# Patient Record
Sex: Male | Born: 1945 | Race: Black or African American | Hispanic: No | Marital: Married | Smoking: Never smoker
Health system: Southern US, Community
[De-identification: ages and names within clinical notes are randomized; demographics above are authoritative.]

## PROBLEM LIST (undated history)

## (undated) DIAGNOSIS — C61 Malignant neoplasm of prostate: Secondary | ICD-10-CM

## (undated) DIAGNOSIS — E119 Type 2 diabetes mellitus without complications: Secondary | ICD-10-CM

## (undated) DIAGNOSIS — C801 Malignant (primary) neoplasm, unspecified: Secondary | ICD-10-CM

## (undated) DIAGNOSIS — E785 Hyperlipidemia, unspecified: Secondary | ICD-10-CM

## (undated) DIAGNOSIS — I442 Atrioventricular block, complete: Secondary | ICD-10-CM

## (undated) DIAGNOSIS — I10 Essential (primary) hypertension: Secondary | ICD-10-CM

## (undated) HISTORY — DX: Atrioventricular block, complete: I44.2

## (undated) HISTORY — DX: Type 2 diabetes mellitus without complications: E11.9

## (undated) HISTORY — DX: Hyperlipidemia, unspecified: E78.5

## (undated) HISTORY — PX: APPENDECTOMY: SHX54

---

## 2004-05-21 ENCOUNTER — Encounter: Admission: RE | Admit: 2004-05-21 | Discharge: 2004-05-21 | Payer: Self-pay | Admitting: *Deleted

## 2004-07-22 ENCOUNTER — Ambulatory Visit (HOSPITAL_COMMUNITY): Admission: RE | Admit: 2004-07-22 | Discharge: 2004-07-22 | Payer: Self-pay | Admitting: Gastroenterology

## 2006-04-14 HISTORY — PX: CHOLECYSTECTOMY: SHX55

## 2006-06-30 ENCOUNTER — Encounter: Payer: Self-pay | Admitting: Emergency Medicine

## 2006-07-01 ENCOUNTER — Inpatient Hospital Stay (HOSPITAL_COMMUNITY): Admission: AD | Admit: 2006-07-01 | Discharge: 2006-07-03 | Payer: Self-pay | Admitting: *Deleted

## 2006-07-24 ENCOUNTER — Encounter: Admission: RE | Admit: 2006-07-24 | Discharge: 2006-07-24 | Payer: Self-pay | Admitting: Gastroenterology

## 2006-08-19 ENCOUNTER — Ambulatory Visit: Payer: Self-pay | Admitting: Gastroenterology

## 2006-08-27 ENCOUNTER — Ambulatory Visit (HOSPITAL_COMMUNITY): Admission: RE | Admit: 2006-08-27 | Discharge: 2006-08-27 | Payer: Self-pay | Admitting: Gastroenterology

## 2006-08-27 ENCOUNTER — Encounter: Payer: Self-pay | Admitting: Gastroenterology

## 2006-09-09 ENCOUNTER — Ambulatory Visit: Payer: Self-pay | Admitting: Gastroenterology

## 2006-11-06 ENCOUNTER — Ambulatory Visit (HOSPITAL_COMMUNITY): Admission: RE | Admit: 2006-11-06 | Discharge: 2006-11-06 | Payer: Self-pay | Admitting: General Surgery

## 2006-11-06 ENCOUNTER — Encounter (INDEPENDENT_AMBULATORY_CARE_PROVIDER_SITE_OTHER): Payer: Self-pay | Admitting: General Surgery

## 2006-11-11 ENCOUNTER — Inpatient Hospital Stay (HOSPITAL_COMMUNITY): Admission: AD | Admit: 2006-11-11 | Discharge: 2006-11-16 | Payer: Self-pay | Admitting: General Surgery

## 2006-11-15 ENCOUNTER — Encounter: Payer: Self-pay | Admitting: Gastroenterology

## 2006-11-16 ENCOUNTER — Ambulatory Visit: Payer: Self-pay | Admitting: Gastroenterology

## 2006-12-02 ENCOUNTER — Ambulatory Visit: Payer: Self-pay | Admitting: Gastroenterology

## 2007-07-21 DIAGNOSIS — I10 Essential (primary) hypertension: Secondary | ICD-10-CM | POA: Insufficient documentation

## 2007-07-21 DIAGNOSIS — Z8719 Personal history of other diseases of the digestive system: Secondary | ICD-10-CM | POA: Insufficient documentation

## 2007-09-19 IMAGING — US US ABDOMEN COMPLETE
1 series · 13 of 25 positions shown · non-contrast
Comparison: GI [REDACTED] abdominal ultrasound 05/21/04, [REDACTED] abdominal ultrasound 07/01/06, [REDACTED] abdominal CT 07/01/06, and [REDACTED] abdominal MRI MRCP 07/03/06.

CLINICAL DATA: Follow up acute pancreatitis. 
COMPLETE ABDOMINAL ULTRASOUND:
TECHNIQUE: Complete abdominal ultrasound examination was performed including evaluation of the liver, gallbladder, bile ducts, pancreas, kidneys, spleen, IVC, and abdominal aorta.

[Series 1: us abdomen complete · 0.43mm/px · 13 of 73 slices shown]
[im 1/73]
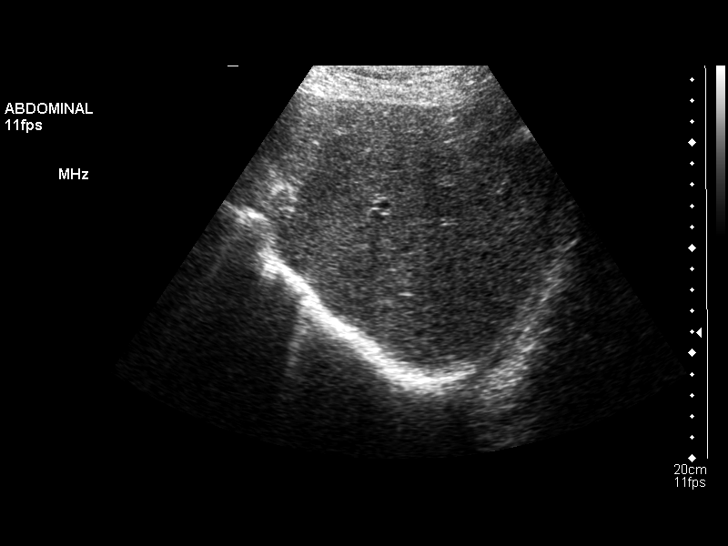
[im 7/73]
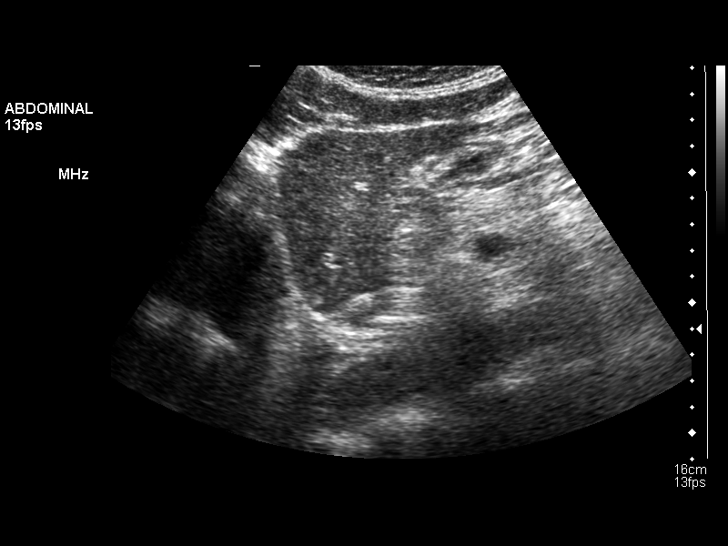
[im 13/73]
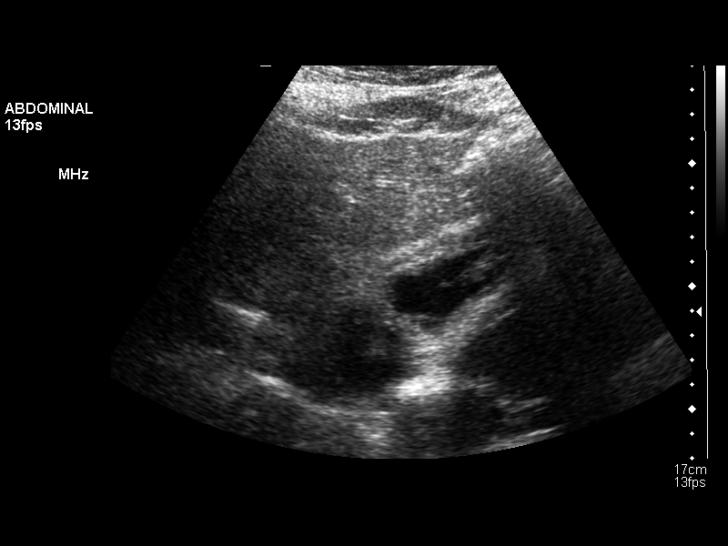
[im 19/73]
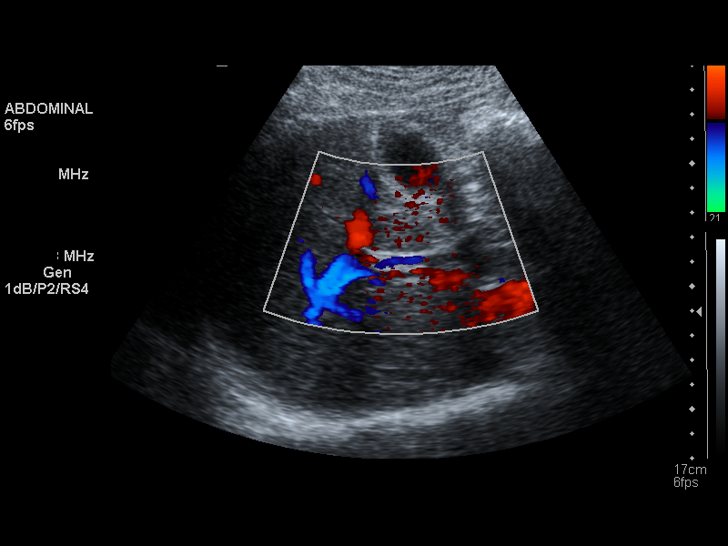
[im 25/73]
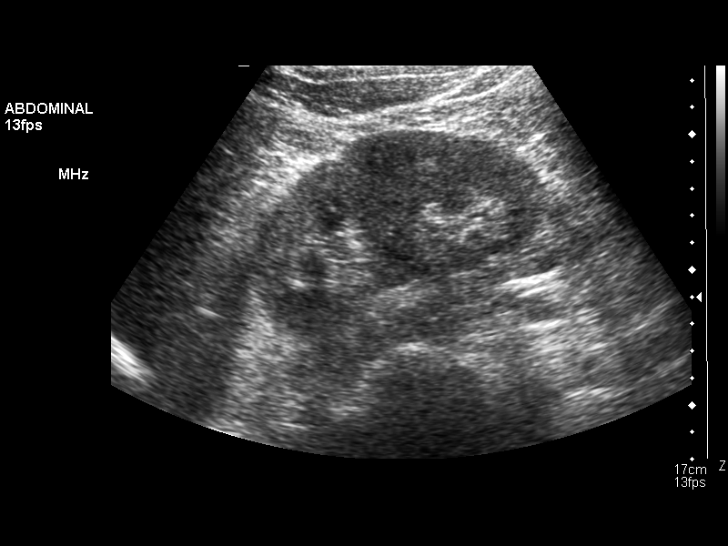
[im 31/73]
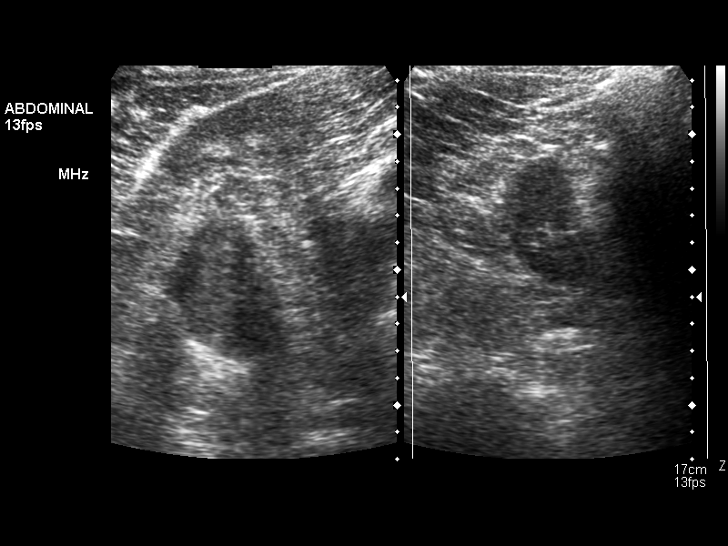
[im 37/73]
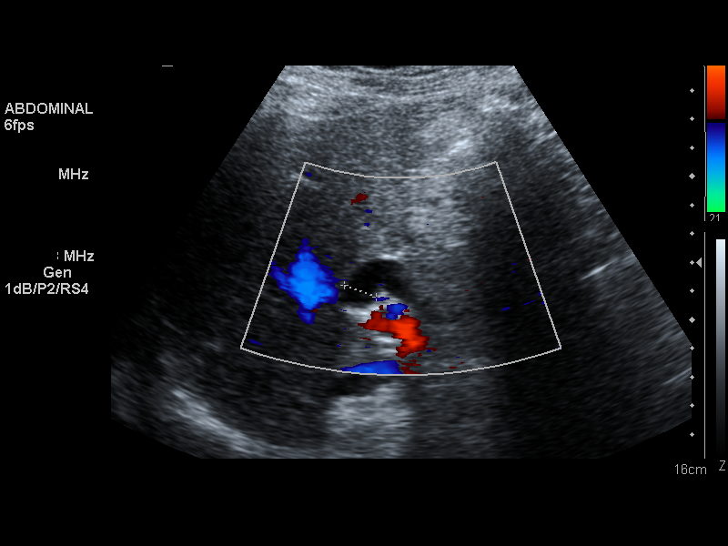
[im 43/73]
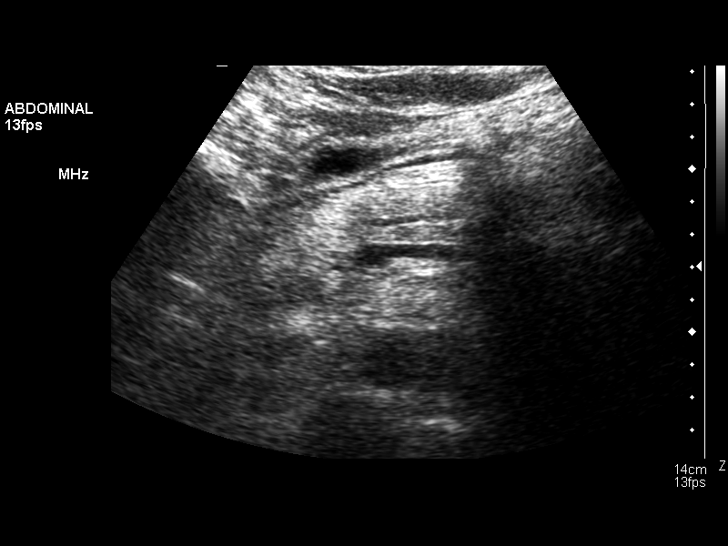
[im 49/73]
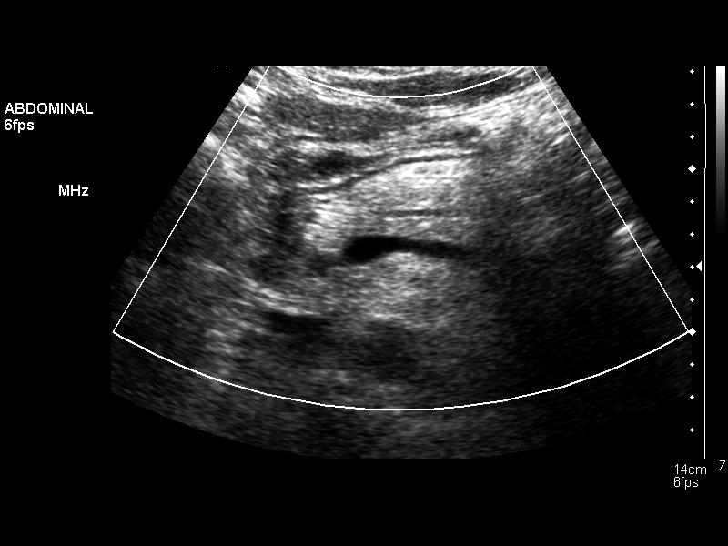
[im 55/73]
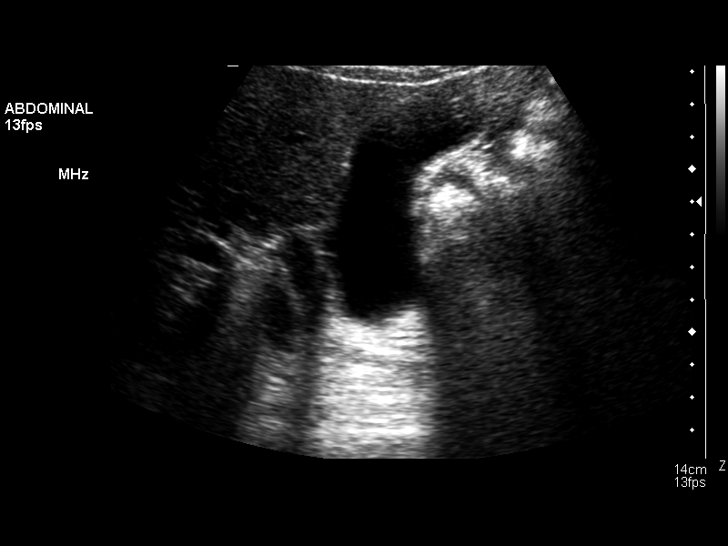
[im 61/73]
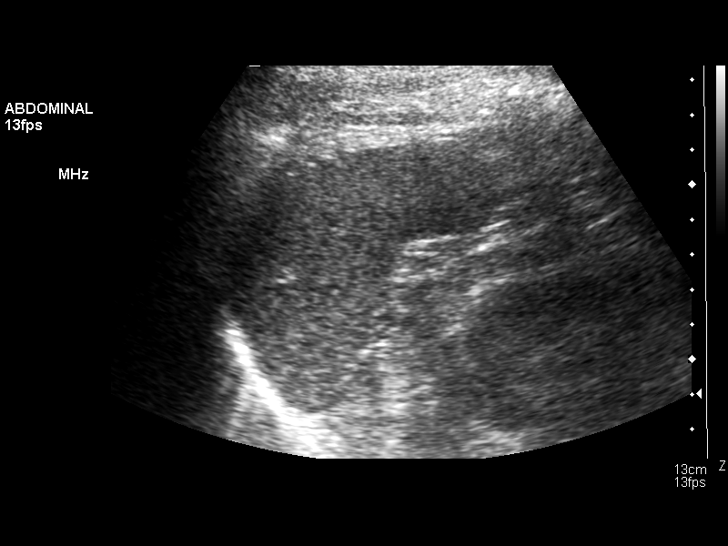
[im 67/73]
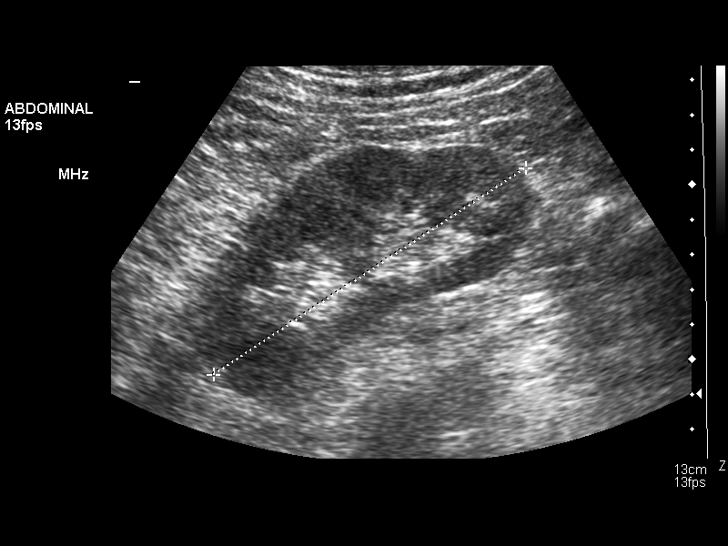
[im 73/73]
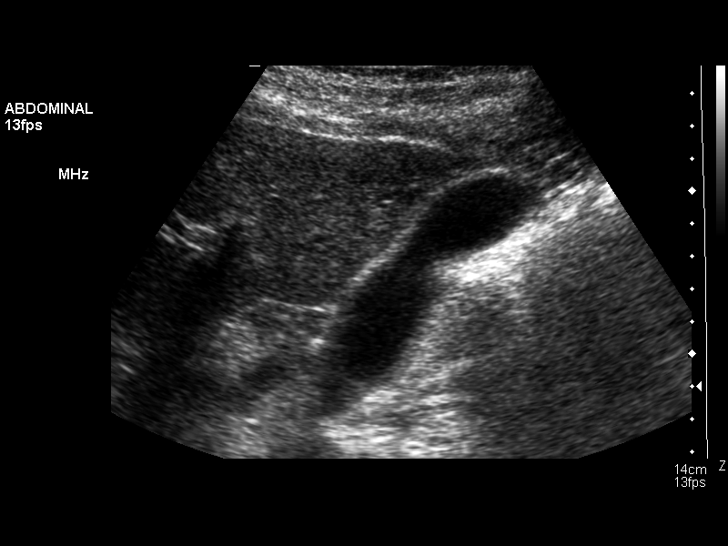

[13 of 25 positions shown; findings below may reference images not displayed]

FINDINGS: No sonographic evidence for cholelithiasis nor gall sludge seen.  Gallbladder wall is slightly indistinct which may represent minimal changes of adenomyomatosis, yet normal wall thickness of 1mm.  No sonographic Murphy?s sign noted.  Stable slight dilatation intrahepatic and extrahepatic bile ducts are seen with the common bile duct measuring 12mm.  Pancreatic duct remains prominent measuring 2.5cm.  Remaining pancreas (tail obscured by overlying gastrointestinal gas) appears sonographically normal.  No focal liver lesions are seen with normal liver size.  Inferior vena cava, spleen (6.5 x 8.8cm), right kidney (12cm long), left kidney (11cm long), and abdominal aorta (maximum proximal diameter 2.4cm) appear sonographically normal.
IMPRESSION: 1.  Stable slight dilated intrahepatic and extrahepatic bile duct and slight prominence of the pancreatic duct with no new obstructing lesion seen. 
2.  Possible mild adenomyomatosis indistinct gallbladder wall of normal thickness. 
3.  Otherwise negative (tail of pancreas obscured).

## 2008-01-07 IMAGING — CR DG CHEST 2V
2 series · 2 of 2 positions shown · non-contrast
Comparison: 06/30/06.

CLINICAL DATA: Abdominal pain, fever and dyspnea.  Gallbladder surgery five days ago. 
 PA AND LATERAL CHEST - 2 VIEW:

[w chest pa]
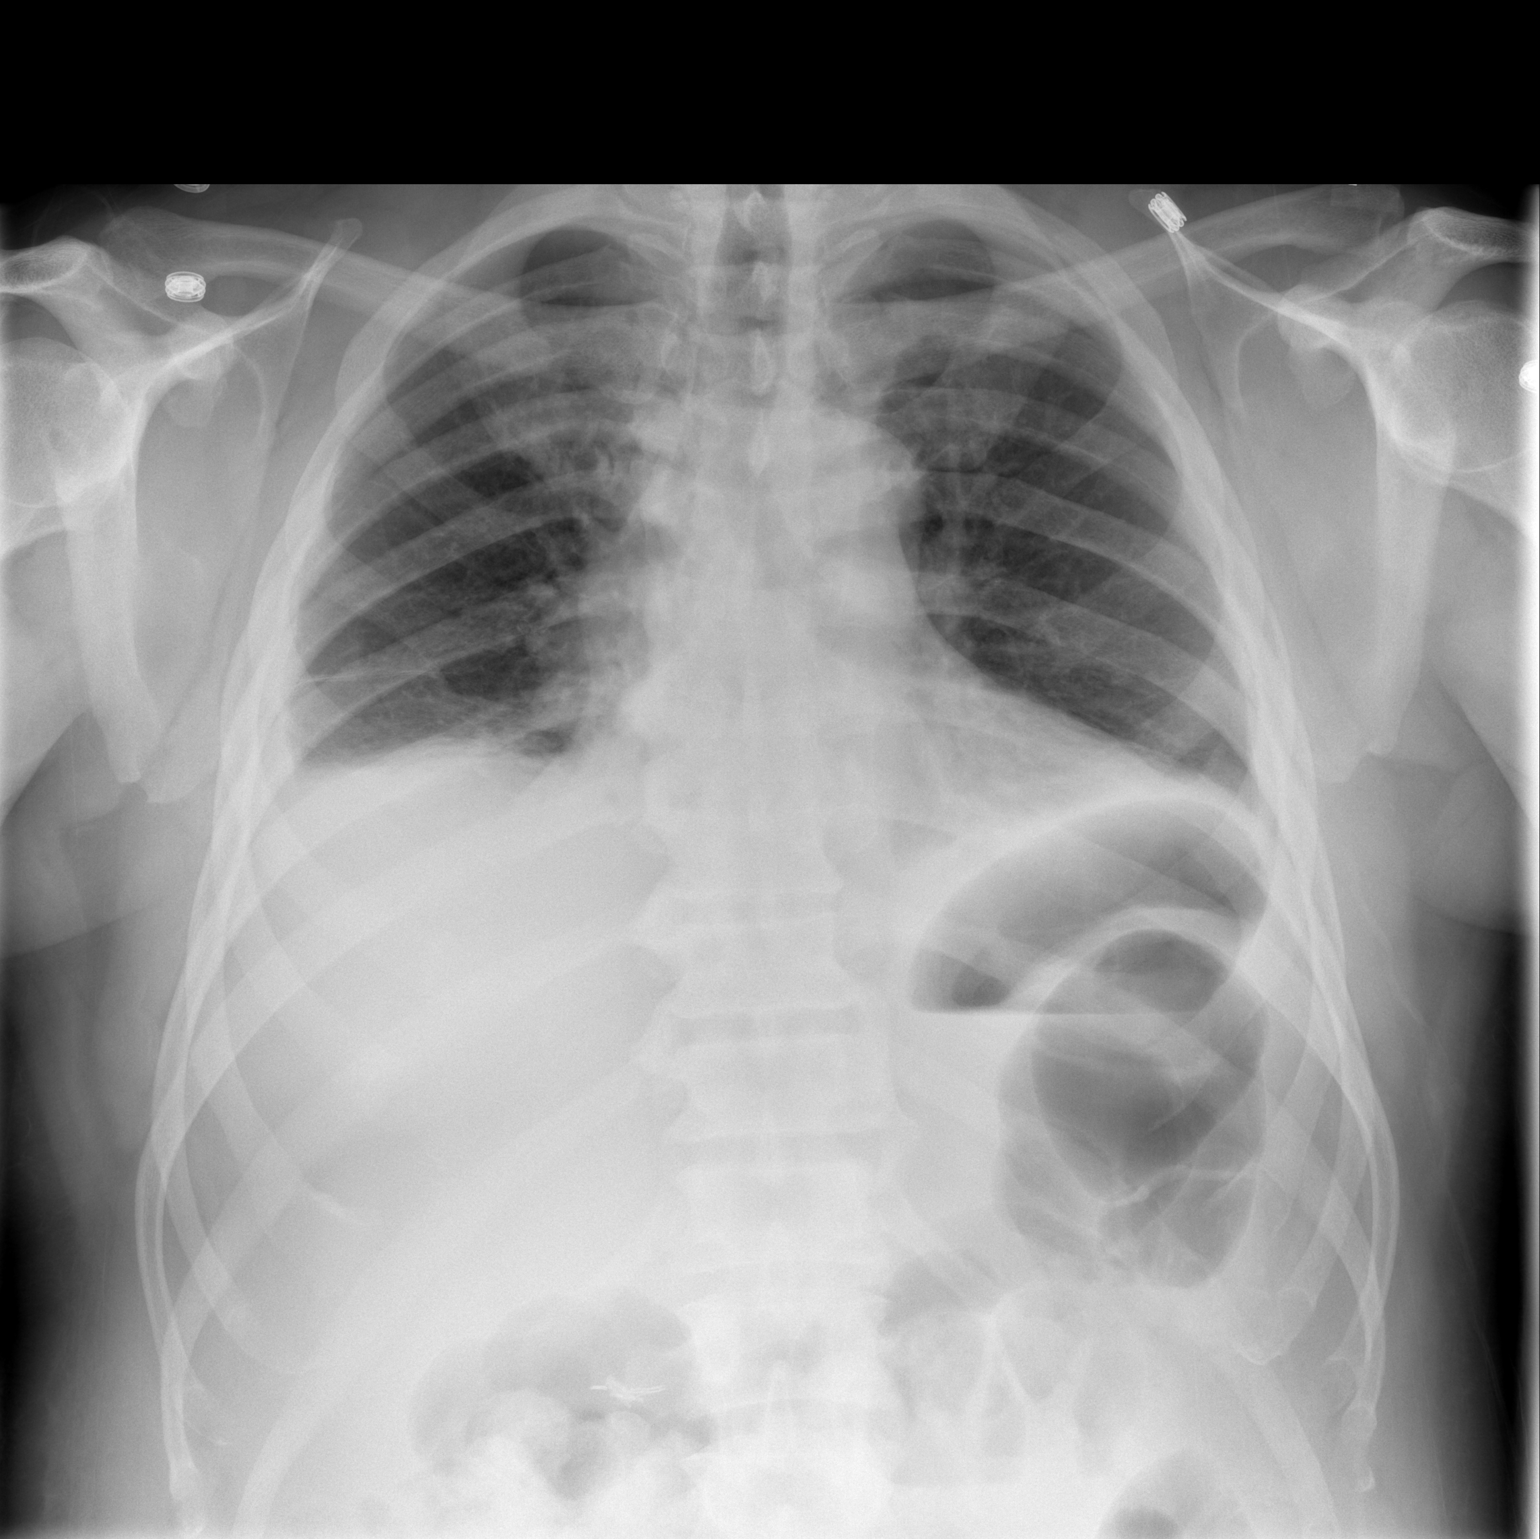

[w chest lat]
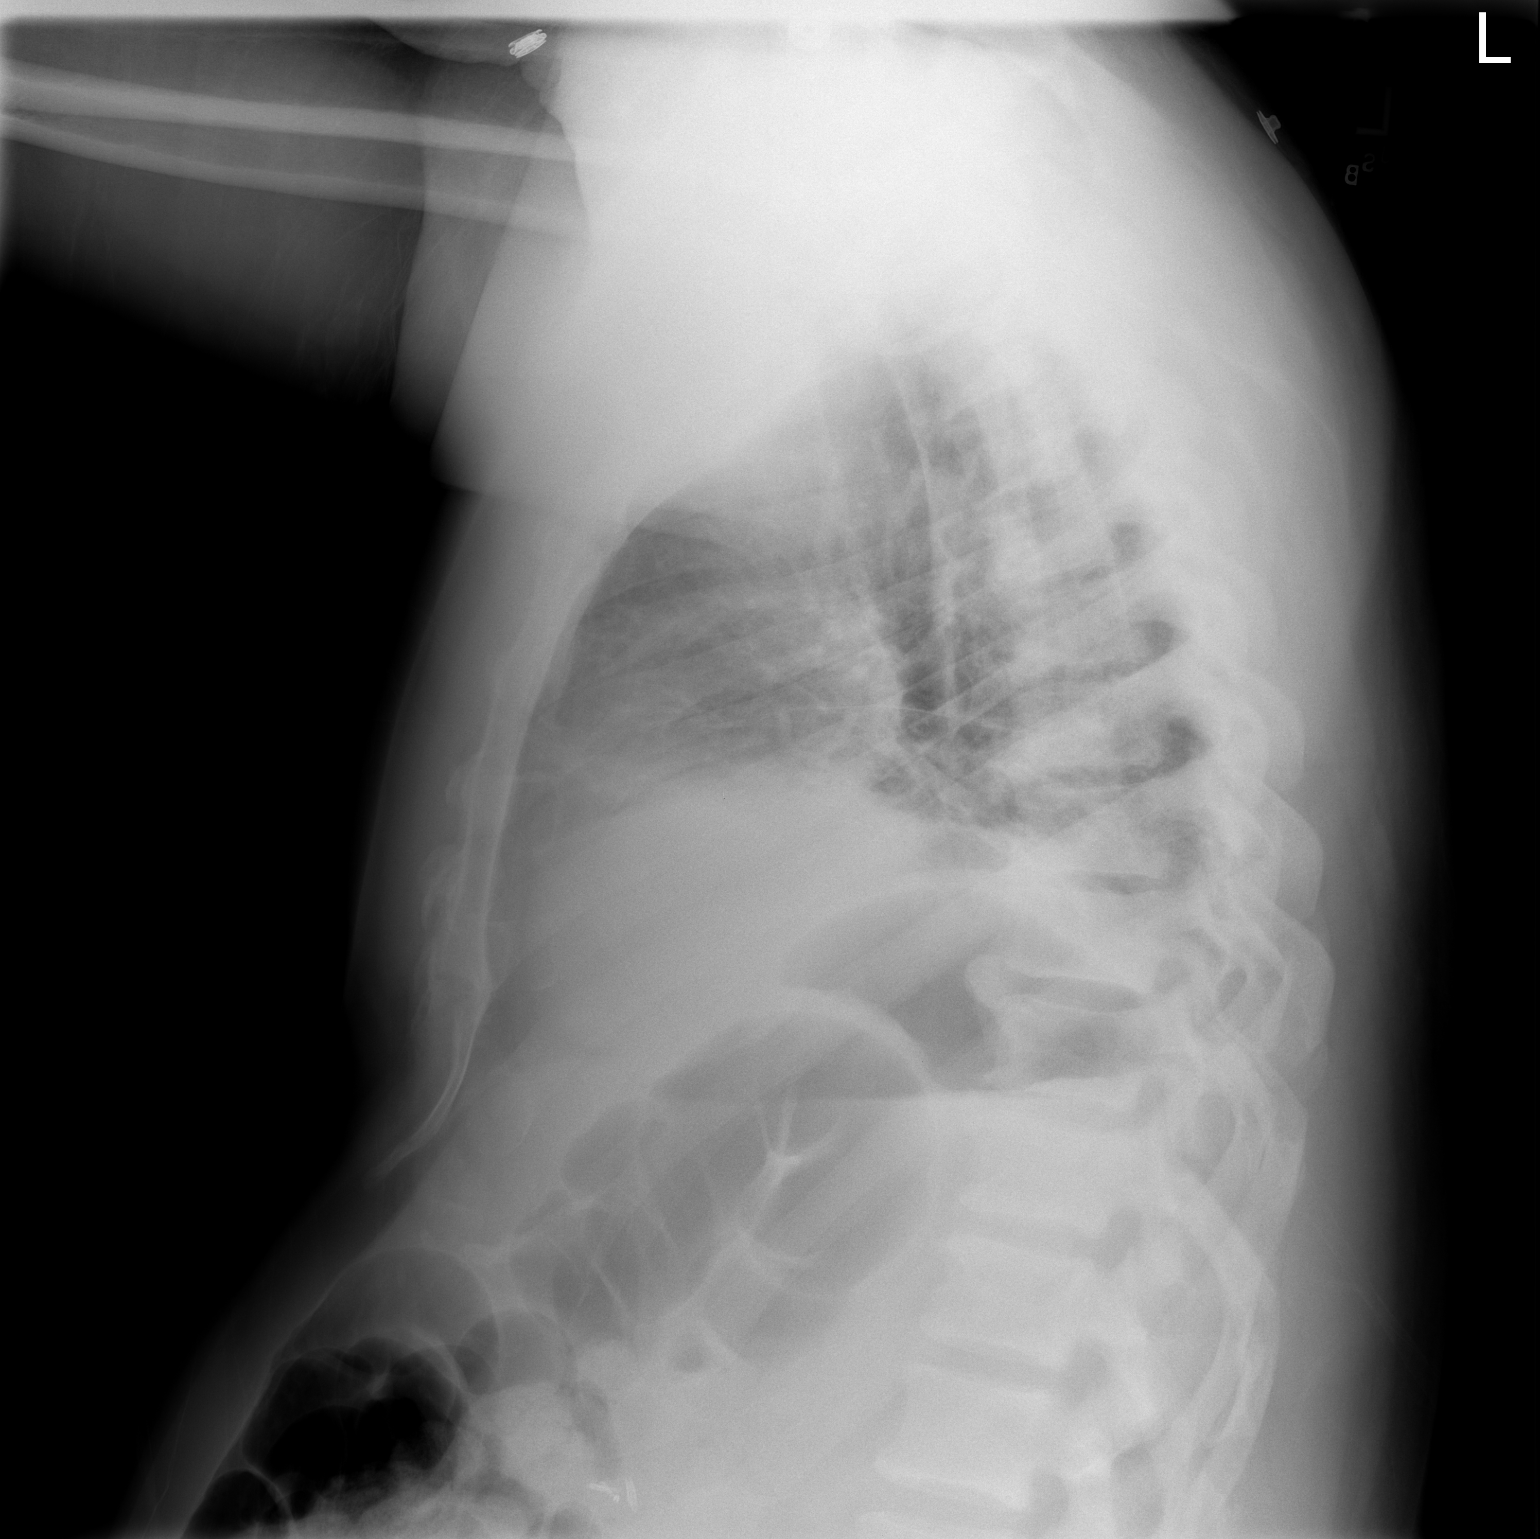

[2 of 2 positions shown; findings below may reference images not displayed]

FINDINGS: There are low lung volumes with new bibasilar atelectasis and a small right pleural effusion.  No edema or pneumothorax is present.  The cardiomediastinal contours are stable for the degree of inspiration with mild cardiac enlargement.  The bones are dense.  Air fluid levels in the upper abdomen are probably due to a postoperative ileus.
IMPRESSION: Bibasilar atelectasis with small right pleural effusion.  Probable postoperative ileus.

## 2010-08-27 NOTE — Discharge Summary (Signed)
NAME:  Corey Avila, Corey Avila              ACCOUNT NO.:  [id number]   MEDICAL RECORD NO.:  [id number]          PATIENT TYPE:  INP   LOCATION:  5729                         FACILITY:  [facility]   PHYSICIAN:  Gabrielle Dare. Janee Morn, M.D.DATE OF BIRTH:  [birth year]   DATE OF ADMISSION:  11/11/2006  DATE OF DISCHARGE:  11/16/2006                               DISCHARGE SUMMARY   DATE OF DISCHARGE:  Anticipated date November 16, 2006.   DISCHARGE DIAGNOSES:  Biliary leak, status post laparoscopic  cholecystectomy.   HISTORY OF PRESENT ILLNESS:  Corey Avila is a 65 year old African-  American gentleman who underwent uncomplicated laparoscopic  cholecystectomy with intraoperative cholangiogram on November 06, 2006.  In  the postoperative period at home, he developed some abdominal pain and  fever.  Around postoperative day 5, he was seen in our office and  admitted from the hospital for further workup.   HOSPITAL COURSE:  The patient underwent evaluation with a CT scan.  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  Liver  function tests were within normal limits.  He had elevated white blood  cell count.  He was placed on intravenous antibiotics.  He underwent CT-  guided drainage of what was a large bile collection, 600 cc or so was  removed.  Drain was left in place.  He improved significantly from a  clinical standpoint.  He defervesced, white count normalized.  He was  seen in consultation by  GI, including Dr. Wendall Papa, who had  seen him in the past for biliary pancreatitis.  He was set up for  endoscopic retrograde cholangiopancreatography.  They were not able to  enter the papilla.  There were some small duodenal ulcers seen.  Further  attempt was made the following day, and again they were unable to  perform ERCP, as they were unable to cannulate the common bile duct.  He  then underwent HIDA scan this morning, which failed to demonstrate any  bile leak; however, he  continues to have bilious output from his drain.  It was recommended by Dr. Christella Hartigan from GI service, may be transferred  for specialty care at [facility] on the biliary service.  Per his  discussions with them today, he has been accepted, pending bed  availability, so he was transferred in stable condition to [facility].   ACTIVITY:  As tolerated.  His drain is to gravity drainage.   MEDICATIONS:  Include:  1. Metoprolol 50 mg p.o. daily.  2. Norvasc 5 mg p.o. daily.  3. Lotensin 20 mg p.o. daily.  4. Protonix 40 mg p.o. daily.  5. IV fluids at this time are 0.9 normal saline with 20 Kay Ciel at      100 mL an hour.  6. He is also on Lovenox 40 mg subcu q. day.  7. Zosyn 3.375 grams IV q.6 h.  8. Tylenol 650 mg p.o. q.4 h. p.r.n.  9. Morphine 2-6 mg IV q.1 h. p.r.n.  10.Lopressor 5 mg IV q.6 h. p.r.n., systolic blood pressure over 160      or diastolic over 90.  Gabrielle Dare Janee Morn, M.D.  Electronically Signed     BET/MEDQ  D:  11/16/2006  T:  11/16/2006  Job:  [id number]

## 2010-08-27 NOTE — Assessment & Plan Note (Signed)
[facility]                         GASTROENTEROLOGY OFFICE NOTE   NAME:Corey Avila, Corey Avila                     MRN:          [id number]  DATE:08/19/2006                            DOB:          Jan 22, 1946    REASON FOR VISIT:  Dr. Evette Cristal asked me to evaluate Corey Avila in  consultation regarding recent pancreatitis.   HISTORY OF PRESENT ILLNESS:  Corey Avila is a very pleasant 65 year old  man who was hospitalized a month and a half ago at [facility]  because of acute abdominal pain.  At admission his amylase and lipase  were both very elevated with a lipase of greater than 1000.  His LFT's  were also elevated with an AST of 222, ALT of 378, and total bilirubin  of 2.7.  It was thought that he may have alcoholic pancreatitis.  He was  formerly a steady drinker, although, the day or two prior to his acute  abdominal pain he had only had 1-2 drinks.  Ultrasound suggested dilated  bile duct, slightly dilated pancreatic duct.  CT scan was performed  showing the same without any overt pancreatic inflammation.  An MRCP was  also performed and this showed diffuse dilation of the entire biliary  tree, questionable tiny stone in the neck of the gallbladder, mild edema  in the gallbladder wall, mild prominence of the pancreatic duct.  He was  discharged home after 3-4 days of conservative management and he has had  no return of his pain since then.  He followed up with Dr. Evette Cristal in the  office.  A repeat ultrasound was performed and this showed stable  dilated intra and extrahepatic bile duct with mild prominence of the PD.  Repeat liver tests were normal.  His lipase was still elevated very  mildly at 78.   He never had pancreatic problems in the past.  No recent medicine  changes.  No abdominal injuries recently.   REVIEW OF SYSTEMS:  Notable for 15 pound weight gain in the past year,  otherwise essentially normal and is available on his nursing  intake  sheet.   PAST MEDICAL HISTORY:  Hypertension, status post appendectomy in 1975.   CURRENT MEDICATIONS:  1. Metoprolol.  2. Vitamin E.  3. Aspirin.  4. Prostiva.  5. Vitamin C.  6. Replenex.  7. Lisinopril.  8. Prostate vitamin.  9. Vitality vitamin.   ALLERGIES:  No known drug allergies.   SOCIAL HISTORY:  Nonsmoker.  He drinks 1-2 alcoholic drinks daily.   FAMILY HISTORY:  Prostate cancer runs in his family.  No colon cancer or  colon polyps in his family.   PHYSICAL EXAMINATION:  VITAL SIGNS:  5 feet 10 inches, 251 pounds, blood  pressure 142/92, pulse 68.  CONSTITUTIONAL:  Generally well-appearing.  NEUROLOGY:  Alert and oriented x3.  HEENT:  Eyes; extraocular movements intact.  Mouth; oropharynx moist  with no lesions.  NECK:  Supple with no lymphadenopathy.  HEART:  Regular rate and rhythm.  LUNGS:  Clear to auscultation bilaterally.  ABDOMEN:  Soft, nontender, and nondistended with normal bowel sounds.  EXTREMITIES:  No lower extremity edema.  SKIN:  No rash or lesions on visible extremities.   ASSESSMENT:  A 65 year old man with recent acute pancreatitis.  It is  not clear if this is a biliary source or alcohol induced.  Elevated  liver tests at admission as well as dilated bile ducts seem to point  more towards a biliary source.  I will proceed with endoscopic  ultrasound likely followed by ERCP.  He knows that there are risks of  pancreatitis, perforation, bleeding.  He understands that they are  usually low risk, but can be quite severe if they occur.  He understands  and wishes to proceed.  I see no reason for any further blood tests or  imaging studies prior to the procedures which we will schedule at his  soonest convenience.     Rachael Fee, MD  Electronically Signed    DPJ/MedQ  DD: 08/19/2006  DT: 08/19/2006  Job #: [id number]   cc:   Graylin Shiver, M.D.

## 2010-08-27 NOTE — Assessment & Plan Note (Signed)
[facility]                         GASTROENTEROLOGY OFFICE NOTE   NAME:WILLIAMSElizardo, Chilson                     MRN:          [id number]  DATE:12/02/2006                            DOB:          18-Feb-1946    PRIMARY CARE PHYSICIAN:  Dr. Bobbye Riggs. Clovis Riley.   PRIMARY SURGEON:  Dr. Violeta Gelinas.   GI PROBLEM LIST:  Likely gallstone pancreatitis April 2008 (very  elevated transaminases at the time).  Question of whether this was  alcohol versus gallstone related.  MRCP suggested dilated CBD.  Endoscopic ultrasound Aug 27, 2006 showed slightly dilated CBD to 10.1  mm but no filling defects.  Uneventful laparoscopic cholecystectomy July  2008:  Normal IOC (no stones), postop biliary leak with large biloma,  percutaneous drain placed by interventional radiology.  Persistent  drainage prompted ERCP attempts twice by Dr. Barnet Pall on November 13, 2006 and November 15, 2006.  The patient eventually transferred to [facility] and under their tertiary care had another ERCP which also  failed to cannulate the bile duct.  A pancreatic stent was placed, and  then two days later ERCP repeated; this time with successful biliary  cannulation, biliary sphincterotomy, and placement of #10 French 3 cm  straight plastic stent.  IR biloma drain pole approximately 1-2 weeks  following the ERCP.   INTERVAL HISTORY:  I last saw Mr. Meints when he was hospitalized at  [facility] to facilitate transfer to [facility] after  two failed ERCP's here at [facility].  His care there is summarized above.  Since the successful ERCP and biliary stent placement, he has done well.  The biloma drain was pulled a week or two ago, and he has not had any  recurrent pain.  He is already scheduled for repeat ERCP tomorrow at  [facility].   CURRENT MEDICATIONS:  1. Metoprolol.  2. Vitamin E.  3. ProstaVan.  4. Replenix.  5. Lisinopril.  6. ProStolic.  7. Vitality  vitamin.   PHYSICAL EXAMINATION:  VITAL SIGNS:  Weight 234 pounds, blood pressure  128/70, pulse 76.  CONSTITUTIONAL:  Generally well-appearing.  ABDOMEN:  Soft, nontender, nondistended, no bowel sounds.   ASSESSMENT/PLAN:  A 65 year old man with 2-3 months of complicated  biliary issues summarized above.   He is having repeat ERCP tomorrow at [facility].  Hopefully that will  simply be removal of his pancreatic and biliary stents.  I did not  mention above, but I did not see any documentation of a  bile leak on their eventual successful CBD cannulation.  He requires no  routine followup here, but knows to get in touch if he has any further  questions or concerns.     Rachael Fee, MD  Electronically Signed    DPJ/MedQ  DD: 12/02/2006  DT: 12/03/2006  Job #: [id number]   cc:   L. Lupe Carney, M.D.  Felipa Emory, M.D.  Gabrielle Dare Janee Morn, M.D.

## 2010-08-27 NOTE — Op Note (Signed)
NAME:  BEACHER, EVERY              ACCOUNT NO.:  [id number]   MEDICAL RECORD NO.:  [id number]          PATIENT TYPE:  AMB   LOCATION:  SDS                          FACILITY:  [facility]   PHYSICIAN:  Gabrielle Dare. Janee Morn, M.D.DATE OF BIRTH:  [birth year]   DATE OF PROCEDURE:  11/06/2006  DATE OF DISCHARGE:  11/06/2006                               OPERATIVE REPORT   PREOPERATIVE DIAGNOSIS:  History of biliary pancreatitis.   POSTOPERATIVE DIAGNOSIS:  History of biliary pancreatitis.   PROCEDURE:  Laparoscopic cholecystectomy with intraoperative  cholangiogram.   SURGEON:  Gabrielle Dare. Janee Morn, M.D.   ASSISTANT:  Leonie Man, M.D.   ANESTHESIA:  General.   HISTORY OF PRESENT ILLNESS:  Mr. Ehresman is a 65 year old African  American gentleman who was recently admitted for biliary pancreatitis.  He has residual sludge in his gallbladder.  He has felt well since that  time. Liver function tests today are normal.  He presents for elective  cholecystectomy.   PROCEDURE IN DETAIL:  Informed consent was obtained. The patient  received intravenous antibiotics.  He was brought to the operating room  and general anesthesia was administered.  His abdomen was prepped and  draped in a sterile fashion.  The infraumbilical region was infiltrated  with 0.25% Marcaine with epinephrine.  An infraumbilical incision was  made, the subcutaneous tissues were dissected down revealing the  anterior fascia.  This was divided sharply.  The peritoneal cavity was  entered and, under direct vision, 0 Vicryl pursestring suture was placed  around the fascial opening and the [facility] trocar was inserted into the  abdomen. The abdomen was insufflated with carbon dioxide in a standard  fashion. Under direct vision, an 11 mm epigastric and two 5 mm lateral  ports were placed.  0.25% Marcaine with epinephrine was used at all port  sites.   The dome of the gallbladder was retracted superomedially.  There was a  lot of  omental adhesions to the gallbladder.  These were gradually swept  down using Bovie cautery and blunt dissection revealing the  infundibulum.  The infundibulum was retracted inferolaterally.  Dissection began laterally and progressed medially, including sweeping  down some more filmy adhesions. The cystic duct was easily identified as  well as the cystic artery.  The cystic artery was circumferentially  dissected, it was clipped twice proximally and once distally, and  divided.  Further dissection continued on the cystic duct until a large  window was created between the cystic duct infundibulum and the liver.  Once this was done with excellent visualization, a clip was placed on  the infundibulum cystic duct junction.  A small nick was made in the  cystic duct and a Reddick cholangiogram catheter was inserted.  Intraoperative cholangiogram was obtained.  This demonstrated a long  length of the cystic duct.  There was no common bile duct filling  defects noted and contrast easily entered the duodenum.   The cholangiogram catheter was removed.  Three clips were placed  proximally on the cystic duct and it was divided.  The gallbladder was  taken off the liver bed with  the Bovie cautery.  A small posterior  branch of the cystic artery was clipped proximally and divided distally  with cautery.  The gallbladder was taken off the bed, placed in an  EndoCatch bag, and was removed from the abdomen via the infraumbilical  port site.  The liver bed was copiously irrigated.  Meticulous  hemostasis was ensured.  Clips were inspected and remained in good  position.  The liver bed was dry.  Irrigation fluid was evacuated and  was clear.  The liver bed was rechecked and remained dry.  Clips  remained in good position.  The ports were then removed under direct  vision.  The pneumoperitoneum was released.  The Hasson trocar was  removed.  The infraumbilical fascia was closed by tying the 0 Vicryl   pursestring suture.  All four wounds were copiously irrigated.  The skin  of each was closed with running 4-0 Vicryl subcuticular stitch.  Sponge,  needle and instrument counts were correct.  Benzoin, Steri-Strips, and  sterile dressings were applied.  The patient tolerated the procedure  well without apparent complication and was taken to recovery in stable  condition.      Gabrielle Dare Janee Morn, M.D.  Electronically Signed     BET/MEDQ  D:  11/06/2006  T:  11/07/2006  Job:  [id number]   cc:   L. Lupe Carney, M.D.  Rachael Fee, MD

## 2010-08-30 NOTE — H&P (Signed)
NAME:  Corey Avila, Corey Avila NO.:  [id number]   MEDICAL RECORD NO.:  [id number]          PATIENT TYPE:  INP   LOCATION:  0102                         [facility]ACILITY:  [facility]   PHYSICIAN:  Lucita Ferrara, MD         DATE OF BIRTH:  [birth year]   DATE OF ADMISSION:  06/30/2006  DATE OF DISCHARGE:                              HISTORY & PHYSICAL   Primary care doctor is L. Lupe Carney, M.D.   The patient is a 65 year old male with past medical history significant  for hypertension, diabetes, and history of carotid tumor, who presents  with severe abdominal pain located midepigastric, inability to tolerate  p.o. intake with nausea and vomiting.  Pain has been getting  progressively worse.  Positive diaphoresis.  Positive feverish feeling.  The patient denies any constipation.  The patient also denies any  cardiac-related chest pain or pressure-like chest pain.  No neurological  deficits.   PAST MEDICAL HISTORY:  1. Diabetes.  2. Hypertension.  3. Carotid tumor.   SURGICAL HISTORY:  The patient had a colonoscopy dated July 22, 2004,  for heme-positive stool.  Colonoscopy was within normal limits.   SOCIAL HISTORY:  Denies any alcohol, smoking or drug intake.   MEDICATIONS:  1. Aleve 500 mg as needed.  2. Vitamin C 500 mg once daily.  3. Aspirin 325 mg once daily.  4. Lisinopril 20 mg every day.   ALLERGIES:  No known drug allergies.   PHYSICAL EXAMINATION:  GENERAL:  The patient is in a moderate amount of  distress with a significant amount of pain.  VITAL SIGNS:  The blood pressure is 142/85, respirations 19, pulse is  62, pulse oximetry is 97% on room air.  HEENT:  Normocephalic, atraumatic.  Sclerae are anicteric.  Mucous  membranes moist.  NECK:  Supple, no JVD, no carotid bruits.  CARDIOVASCULAR:  S1, S2, regular rate and rhythm.  No murmurs or clicks.  ABDOMEN:  Distended.  Positive hyperactive bowel sounds.  Positive  guarding.  EXTREMITIES:  No clubbing,  cyanosis, or edema.  LUNGS:  Clear to auscultation bilaterally.  No wheezes, rhonchi or  rales.   LABORATORY DATA:  White count 9.9, hemoglobin 13.6, hematocrit 40,  platelets 185.  Cardiac enzymes negative.  Sodium 134, potassium 2.7,  chloride 103, CO2 24, glucose 202, BUN 13, creatinine 1.48, calcium 8.4.  Protein 6.2, albumin 3.3, AST 519, ALT very high, too high to detect.  Ultrasound of the abdomen and gallbladder shows CBD dilatation.  No  gallbladder wall thickening.  No sludge.  Otherwise normal ultrasound.  Chest x-ray shows a bibasilar infiltrate, otherwise normal.  Cardiac  enlargement.   ASSESSMENT AND PLAN:  This is a 65 year old male with signs and symptoms  and clinically ill with acute pancreatitis.  We will admit to the  regular floor for IV hydration.  We will keep the patient n.p.o.  We  will go ahead and start broad-spectrum antibiotics for patient.  The  patient will be monitored with strict I&O's and daily weights.  We will  await the results of CT scan to  see if there any abscesses or cysts.  As  needed, gastroenterology or surgery will be consulted if indicated.  Electrolytes will be repleted.  The patient will be kept n.p.o. for  bowel rest.  The pain will be managed with Dilaudid 2 mg IV q.3h. as  needed.  For elevated blood sugar, the patient will be placed on sliding  scale insulin.      Lucita Ferrara, MD  Electronically Signed     RR/MEDQ  D:  07/01/2006  T:  07/01/2006  Job:  [id number]   cc:   L. Lupe Carney, M.D.  Fax: [phone]

## 2010-08-30 NOTE — Discharge Summary (Signed)
NAME:  Corey Avila, Corey Avila              ACCOUNT NO.:  [id number]   MEDICAL RECORD NO.:  [id number]          PATIENT TYPE:  INP   LOCATION:  6737                         FACILITY:  [facility]   PHYSICIAN:  Michelene Gardener, MD    DATE OF BIRTH:  03-28-1946   DATE OF ADMISSION:  07/01/2006  DATE OF DISCHARGE:  07/03/2006                               DISCHARGE SUMMARY   PRIMARY CARE PHYSICIAN:  L. Lupe Carney, M.D.   DISCHARGE DIAGNOSES:  1. Acute pancreatitis.  2. Diabetes mellitus.  3. Hypertension.  4. Carotid tumor.  5. Elevated liver function tests.  6. Dilated common bile duct.   DISCHARGE MEDICATIONS:  1. Lisinopril 20 mg p.o. once daily.  2. Aspirin 325 mg p.o. once daily.  3. Vitamin C 500 mg p.o. once daily.  4. Aleve 500 mg as needed.   CONSULTATIONS:  GI consult by Dr. Herbert Moors.   PROCEDURES:  None.   RADIOLOGY STUDIES:  1. Chest x-ray done March18 showed mild cardiac enlargement with      possible bilateral atelectasis.  2. Ultrasound of the abdomen done March19 and showed common bile      duct dilatation without obvious cause with normal-appearing      gallbladder.  3. CT scan of the abdomen showed mild intrahepatic and proximal common      bile duct dilatation with no pancreatic lesion.  4. MRCP done March 21 showed diffuse dilatation of entire biliary tree      to the level of the ampulla with no discrete mass or stone in the      bile duct, just possible tiny stones in the neck of the      gallbladder.  There is a possible evidence of mild acute      pancreatitis.   COURSE OF HOSPITALIZATION.:  ACUTE PANCREATITIS:  This patient was admitted to the hospital and was  kept n.p.o. initially, and then the food was advanced as the patient  tolerated.  The patient was kept on IV fluids and was given pain  medications and antiemetics during the hospitalization.  The patient  responded to this very quickly.  Studies were done including ultrasound,  CT scan of the  abdomen, and MRCP, and the results were mentioned above.  GI consultation was done by Dr. Evette Cristal who recommended MRCP. Since the  patient is stable without acute findings on the MRCP, it has been  decided to follow the patient as an outpatient from the GI point of  view.  The patient was sent home and was given an appointment to see Dr.  Herbert Moors.   DISPOSITION:  Otherwise, other medical conditions remained stable, and  the patient was continued on the same medications as taken at home.   Assessment time:  40 minutes.      Michelene Gardener, MD  Electronically Signed     NAE/MEDQ  D:  08/09/2006  T:  08/09/2006  Job:  [id number]   cc:   L. Lupe Carney, M.D.

## 2010-08-30 NOTE — Op Note (Signed)
NAME:  Corey Avila, Corey Avila              ACCOUNT NO.:  [id number]   MEDICAL RECORD NO.:  [id number]          PATIENT TYPE:  AMB   LOCATION:  ENDO                         FACILITY:  [facility]   PHYSICIAN:  John C. Madilyn Fireman, M.D.    DATE OF BIRTH:  March 07, 1946   DATE OF PROCEDURE:  07/22/2004  DATE OF DISCHARGE:                                 OPERATIVE REPORT   PROCEDURE:  Colonoscopy.   INDICATIONS FOR PROCEDURE:  Heme positive stool.   PROCEDURE:  The patient was placed in the left lateral decubitus position  and placed on the pulse monitor with continuous low flow oxygen delivered by  nasal cannula.  He was sedated with 75 mcg IV fentanyl and 6 mg IV Versed.  The Olympus video colonoscope was inserted into the rectum and advanced to  the cecum, confirmed by transillumination at McBurney's point and  visualization of the ileocecal valve and appendiceal orifice.  Prep was  good.  The cecum, ascending, transverse, descending, sigmoid, and rectum all  appeared normal, with no masses, polyps, diverticula, or other mucosal  abnormalities.  The rectum likewise appeared normal on retroflexed view.  The anus revealed no obvious internal hemorrhoids.  The scope was then  withdrawn and the patient returned to the recovery room in stable condition.  He tolerated the procedure well, and there were no immediate complications.   IMPRESSION:  Normal colonoscopy.   PLAN:  Next colon screening by sigmoidoscopy in five years.      JCH/MEDQ  D:  07/22/2004  T:  07/22/2004  Job:  [id number]   cc:   L. Lupe Carney, M.D.  [address] [place]  [place] [address]  Fax: [phone]

## 2010-08-30 NOTE — Consult Note (Signed)
NAME:  Corey Avila, Corey Avila NO.:  [id number]   MEDICAL RECORD NO.:  [id number]          PATIENT TYPE:  INP   LOCATION:  6707                         FACILITY:  [facility]   PHYSICIAN:  Stephani Police, PA    DATE OF BIRTH:  1945-12-26   DATE OF CONSULTATION:  DATE OF DISCHARGE:                                 CONSULTATION   REASON FOR CONSULTATION:  We were asked to see Corey Avila today by Dr.  Arthor Captain in consultation for pancreatitis, today's date July 02, 2006.   HISTORY OF PRESENT ILLNESS:  This is a 65 year old male who regularly  drinks alcohol.  He suddenly experienced severe onset of abdominal pain  Tuesday a.m. at approximately 9:15.  The pain resolved by Wednesday  evening.  Corey Avila is positive for nausea and vomiting.  His primary  symptom is intense pain.  He denies any hematemesis, melena or  hematochezia.  Corey Avila last solid food was on Tuesday.  He is  positive for NSAID use with a daily 325 mg aspirin and an occasional  Aleve tablet.   PAST MEDICAL HISTORY:  Significant for type 2 diabetes, hypertension, a  carotid tumor.  He also had a normal colonoscopy by Dr. Dorena Cookey in  2006.   ALLERGIES:  HE HAS NO KNOWN DRUG ALLERGIES.   MEDICATIONS:  Include lisinopril, metoprolol, 325 mg aspirin per day,  vitamin C, Replenix, saw palmetto, vitamin E and occasional Aleve.   SOCIAL HISTORY:  He claims that he used to be a heavier drinker, now he  just sips alcohol.  He has approximately two alcoholic beverages Monday  night, before the onset of the pain Tuesday morning.  He does not use  tobacco.  He does not use recreational drugs.  He teaches at [employer].   FAMILY HISTORY:  Significant for no GI cancers and no pancreatitis.   REVIEW OF SYSTEMS:  Was noncontributory.   PHYSICAL EXAM:  GENERAL:  He is alert and oriented and in no apparent  distress.  He looks well.  He is in no pain currently.  CARDIOVASCULAR:  System has regular  rate and rhythm.  LUNGS:  Clear.  ABDOMEN:  Soft, nontender, has mild distension, quiet bowel sounds.  The  patient tells me that his abdomen is usually about this shape.   LABS:  Show a hemoglobin 12.6, white count 7.3, platelets 142,  hematocrit 36.5.  Chem-7 is within normal limits.  Amylase today 183,  lipase 81.  His lipase on March 18 was 1,210.  Triglycerides are within  normal limits at 101.  LFTs are still elevated.  AST is 222, ALT is 378,  alk phos 127, total bilirubin 2.7.   Vital signs show temperature 98.7, pulse 60, respirations are 18, blood  pressure is 166/92.   DIAGNOSTICS:  He had an ultrasound of his abdomen that showed dilatation  of the common bile duct with no stones in the gallbladder or the common  bile duct.  He had a CT of his abdomen done yesterday that showed  proximal dilatation of the common bile duct,  the diameter of the duct  being 11 mm.  It tapers to the pancreatic head, and there is slight  stranding around the gallbladder.   ASSESSMENT:  This is pancreatitis, likely secondary to alcohol abuse,  now resolving.  Dr. Beverely Risen has seen and examined the patient.  He  recommends that we get an MRCP this evening, re-check his LFTs in the  morning, advance his diet to clear liquids.  If he tolerates that well,  then continue to advance his diet.  Dr. Beverely Risen states that, if his LFTs  do not rise, his pancreatitis can be followed as an outpatient.  Thank  you very much for this consultation.      Stephani Police, [place]     MLY/MEDQ  D:  07/02/2006  T:  07/02/2006  Job:  [id number]   cc:   Graylin Shiver, M.D.  Michelene Gardener, MD

## 2010-11-04 DIAGNOSIS — C61 Malignant neoplasm of prostate: Secondary | ICD-10-CM

## 2010-11-04 HISTORY — DX: Malignant neoplasm of prostate: C61

## 2010-12-05 ENCOUNTER — Ambulatory Visit
Admission: RE | Admit: 2010-12-05 | Discharge: 2010-12-05 | Disposition: A | Discharge status: Home / Self Care | Payer: BC Managed Care – PPO | Source: Ambulatory Visit | Attending: Radiation Oncology | Admitting: Radiation Oncology

## 2010-12-05 DIAGNOSIS — Z79899 Other long term (current) drug therapy: Secondary | ICD-10-CM | POA: Insufficient documentation

## 2010-12-05 DIAGNOSIS — C61 Malignant neoplasm of prostate: Secondary | ICD-10-CM | POA: Insufficient documentation

## 2010-12-05 DIAGNOSIS — Z510 Encounter for antineoplastic radiation therapy: Secondary | ICD-10-CM | POA: Insufficient documentation

## 2010-12-05 DIAGNOSIS — Z7982 Long term (current) use of aspirin: Secondary | ICD-10-CM | POA: Insufficient documentation

## 2010-12-05 DIAGNOSIS — R350 Frequency of micturition: Secondary | ICD-10-CM | POA: Insufficient documentation

## 2010-12-05 DIAGNOSIS — I10 Essential (primary) hypertension: Secondary | ICD-10-CM | POA: Insufficient documentation

## 2011-01-03 ENCOUNTER — Encounter: Payer: Self-pay | Admitting: Radiation Oncology

## 2011-01-27 LAB — CBC
HCT: 44.5
Hemoglobin: 12.9 — ABNORMAL LOW
Hemoglobin: 15
MCHC: 33.6
MCHC: 33.8
MCHC: 33.8
MCV: 89
MCV: 90.1
Platelets: 188
RBC: 4.63
RDW: 14.2 — ABNORMAL HIGH
RDW: 13.8
RDW: 13.9

## 2011-01-27 LAB — COMPREHENSIVE METABOLIC PANEL
ALT: 38
AST: 29
Albumin: 2.1 — ABNORMAL LOW
Alkaline Phosphatase: 75
BUN: 12
Calcium: 8.2 — ABNORMAL LOW
Calcium: 8.8
GFR calc Af Amer: 59 — ABNORMAL LOW
GFR calc non Af Amer: 40 — ABNORMAL LOW
Glucose, Bld: 156 — ABNORMAL HIGH
Glucose, Bld: 187 — ABNORMAL HIGH
Potassium: 4.2
Sodium: 138
Total Protein: 5.7 — ABNORMAL LOW
Total Protein: 6.4

## 2011-01-27 LAB — URINALYSIS, ROUTINE W REFLEX MICROSCOPIC
Hgb urine dipstick: NEGATIVE
Ketones, ur: 15 — AB
Specific Gravity, Urine: 1.029
pH: 5.5

## 2011-01-27 LAB — HEPATIC FUNCTION PANEL
Albumin: 3.6
Alkaline Phosphatase: 50
Indirect Bilirubin: 0.5
Total Bilirubin: 0.6
Total Protein: 6.9

## 2011-01-27 LAB — CULTURE, ROUTINE-ABSCESS
Culture: NO GROWTH
Gram Stain: NONE SEEN

## 2011-01-27 LAB — BASIC METABOLIC PANEL
BUN: 13
CO2: 28
Calcium: 9.6
Chloride: 106
Creatinine, Ser: 1.33
GFR calc Af Amer: >60
Glucose, Bld: 92

## 2011-01-27 LAB — URINE MICROSCOPIC-ADD ON

## 2011-01-27 LAB — CULTURE, BLOOD (ROUTINE X 2): Culture: NO GROWTH

## 2011-01-27 LAB — URINE CULTURE: Special Requests: NEGATIVE

## 2011-02-12 ENCOUNTER — Ambulatory Visit (HOSPITAL_BASED_OUTPATIENT_CLINIC_OR_DEPARTMENT_OTHER)
Admission: RE | Admit: 2011-02-12 | Discharge: 2011-02-12 | Disposition: A | Discharge status: Home / Self Care | Payer: BC Managed Care – PPO | Source: Ambulatory Visit | Attending: Urology | Admitting: Urology

## 2011-02-12 ENCOUNTER — Ambulatory Visit
Admission: RE | Admit: 2011-02-12 | Discharge: 2011-02-12 | Disposition: A | Discharge status: Home / Self Care | Payer: BC Managed Care – PPO | Source: Ambulatory Visit | Attending: Urology | Admitting: Urology

## 2011-02-12 ENCOUNTER — Other Ambulatory Visit: Payer: Self-pay | Admitting: Urology

## 2011-02-12 DIAGNOSIS — C61 Malignant neoplasm of prostate: Secondary | ICD-10-CM

## 2011-02-24 NOTE — Progress Notes (Signed)
This office note has been dictated.  End of treatment / Chart Closed  ---------------------------------- Artist Pais. Kathrynn Running, M.D. Radiation Oncology [phone]

## 2011-02-24 NOTE — Progress Notes (Signed)
This office note has been dictated.  Sim Treatment Planning Note ---------------------------------- Artist Pais. Kathrynn Running, M.D. Radiation Oncology [phone]

## 2011-02-27 NOTE — Progress Notes (Signed)
CC:   Corey Avila, M.D.  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  NARRATIVE:  [name] was brought to the radiation planning suite today.  He provided informed written consent to proceed with external beam radiotherapy followed by prostate brachytherapy boost.  He was placed supine on the CT couch.  An alpha cradle immobilization device was fabricated for proper leg positioning.  Then, CT images were acquired and transferred to 3 dimensional planning computer.  There, the patient's prostate was contoured as the target structure.  The rectum was contoured for exclusion along with the bladder, small bowel and hips.  Then 4 complex treatment devices were fabricated to shape radiation around the prostate from the anterior, posterior, right lateral, and left lateral gantry positions.  This 4 field set up will receive 45 Gy using image guided radiotherapy by positioning of 3 gold markers in the prostate on a daily basis.  Following completion of external beam radiation treatment, the patient will undergo prostate brachytherapy seed implant to deliver an additional dose of 110 Gy for a total dose of 155 Gy.  The pubic arch was assessed today and appears to be amenable to prostate brachytherapy from a geometry standpoint.    ______________________________ Oneita Hurt, M.D. MAM/MEDQ  D:  02/24/2011  T:  02/27/2011  Job:  [id number]

## 2011-02-27 NOTE — Progress Notes (Signed)
CC:   Corey Avila, M.D. Corey Avila, M.D.   DIAGNOSIS:  A 65 year old gentleman with stage T1c adenocarcinoma of the prostate with a Gleason score 3+4 and a PSA of 5.7.  INDICATION FOR TREATMENT:  Curative external beam radiotherapy with prostate seed boost.  RADIATION TREATMENT DATES:  01/13/2011 through 02/14/2011.  SITE/DOSE:  The patient's prostate received 45 Gy in 25 fractions of 1.8 Gy.  BEAMS/ENERGY:  The patient was treated using a four-field setup with anterior, posterior, right lateral, and left lateral gantry positions. These fields were treated with 18 megavolt photons with custom blocking using MLC apertures to shape radiation conformally around the prostate only.  Image guidance was performed on a daily basis with orthogonal kilovoltage images, prompting shifts prior to each treatment.  The Alpha Cradle device was used for patient positioning.  NARRATIVE:  Corey Avila tolerated his course of radiation treatment relatively well.  He did not experience any acute toxicities related to radiation other than some minimal increased urinary frequency.  PLAN:  The patient will proceed to prostate seed boost with Dr. Ihor Gully to deliver an additional 110 Gy for a total nominal dose of 155 Gy to the prostate.    ______________________________ Oneita Hurt, M.D. MAM/MEDQ  D:  02/24/2011  T:  02/27/2011  Job:  [id number]

## 2011-03-03 ENCOUNTER — Encounter (HOSPITAL_BASED_OUTPATIENT_CLINIC_OR_DEPARTMENT_OTHER): Payer: Self-pay | Admitting: *Deleted

## 2011-03-03 NOTE — Pre-Procedure Instructions (Signed)
No lab results found.  Pt called and instructed to go to [facility] for blood work no later than Wed 11/21.  Pt verbalized his understanding.

## 2011-03-03 NOTE — Pre-Procedure Instructions (Signed)
Pt instructed NPO p MN except norvasc w/ sip of h20.  To [facility] 11/26 @ 0945. Pt  aware of fleets enema to be done am of surgery

## 2011-03-04 LAB — PROTIME-INR
INR: 1.01 (ref 0.00–1.49)
Prothrombin Time: 13.5 seconds (ref 11.6–15.2)

## 2011-03-04 LAB — COMPREHENSIVE METABOLIC PANEL
ALT: 21 U/L (ref 0–53)
AST: 16 U/L (ref 0–37)
Albumin: 3.6 g/dL (ref 3.5–5.2)
Alkaline Phosphatase: 67 U/L (ref 39–117)
BUN: 16 mg/dL (ref 6–23)
CO2: 30 mEq/L (ref 19–32)
Calcium: 9.7 mg/dL (ref 8.4–10.5)
Chloride: 103 mEq/L (ref 96–112)
Creatinine, Ser: 1.46 mg/dL — ABNORMAL HIGH (ref 0.50–1.35)
GFR calc Af Amer: 56 mL/min — ABNORMAL LOW (ref >90)
GFR calc non Af Amer: 49 mL/min — ABNORMAL LOW (ref >90)
Glucose, Bld: 98 mg/dL (ref 70–99)
Potassium: 4 mEq/L (ref 3.5–5.1)
Sodium: 141 mEq/L (ref 135–145)
Total Bilirubin: 0.3 mg/dL (ref 0.3–1.2)
Total Protein: 7.1 g/dL (ref 6.0–8.3)

## 2011-03-04 LAB — CBC
HCT: 39.5 % (ref 39.0–52.0)
Hemoglobin: 13.4 g/dL (ref 13.0–17.0)
MCH: 30.7 pg (ref 26.0–34.0)
MCHC: 33.9 g/dL (ref 30.0–36.0)
MCV: 90.4 fL (ref 78.0–100.0)
Platelets: 162 10*3/uL (ref 150–400)
RBC: 4.37 MIL/uL (ref 4.22–5.81)
RDW: 13.4 % (ref 11.5–15.5)
WBC: 5.1 10*3/uL (ref 4.0–10.5)

## 2011-03-04 LAB — APTT: aPTT: 35 seconds (ref 24–37)

## 2011-03-05 ENCOUNTER — Telehealth: Payer: Self-pay | Admitting: *Deleted

## 2011-03-10 ENCOUNTER — Ambulatory Visit: Payer: BC Managed Care – PPO | Admitting: Radiation Oncology

## 2011-03-10 ENCOUNTER — Ambulatory Visit (HOSPITAL_COMMUNITY): Payer: BC Managed Care – PPO

## 2011-03-10 ENCOUNTER — Encounter (HOSPITAL_BASED_OUTPATIENT_CLINIC_OR_DEPARTMENT_OTHER)
Admission: RE | Disposition: A | Discharge status: Home / Self Care | Payer: Self-pay | Source: Ambulatory Visit | Attending: Urology

## 2011-03-10 ENCOUNTER — Encounter (HOSPITAL_BASED_OUTPATIENT_CLINIC_OR_DEPARTMENT_OTHER): Payer: Self-pay | Admitting: Anesthesiology

## 2011-03-10 ENCOUNTER — Ambulatory Visit (HOSPITAL_BASED_OUTPATIENT_CLINIC_OR_DEPARTMENT_OTHER)
Admission: RE | Admit: 2011-03-10 | Discharge: 2011-03-10 | Disposition: A | Discharge status: Home / Self Care | Payer: BC Managed Care – PPO | Source: Ambulatory Visit | Attending: Urology | Admitting: Urology

## 2011-03-10 ENCOUNTER — Ambulatory Visit (HOSPITAL_BASED_OUTPATIENT_CLINIC_OR_DEPARTMENT_OTHER): Payer: BC Managed Care – PPO | Admitting: Anesthesiology

## 2011-03-10 DIAGNOSIS — I10 Essential (primary) hypertension: Secondary | ICD-10-CM | POA: Insufficient documentation

## 2011-03-10 DIAGNOSIS — Z79899 Other long term (current) drug therapy: Secondary | ICD-10-CM | POA: Insufficient documentation

## 2011-03-10 DIAGNOSIS — C61 Malignant neoplasm of prostate: Secondary | ICD-10-CM | POA: Insufficient documentation

## 2011-03-10 HISTORY — DX: Essential (primary) hypertension: I10

## 2011-03-10 HISTORY — PX: RADIOACTIVE SEED IMPLANT: SHX5150

## 2011-03-10 HISTORY — DX: Malignant (primary) neoplasm, unspecified: C80.1

## 2011-03-10 SURGERY — INSERTION, RADIATION SOURCE, PROSTATE
Anesthesia: General

## 2011-03-10 MED ORDER — KETOROLAC TROMETHAMINE 30 MG/ML IJ SOLN: 15.0000 mg | Freq: Once | INTRAMUSCULAR | Status: DC | PRN

## 2011-03-10 MED ORDER — HYDRALAZINE HCL 20 MG/ML IJ SOLN
INTRAMUSCULAR | Status: DC | PRN
  Administered 2011-03-10 (×2): 5 mg via INTRAVENOUS

## 2011-03-10 MED ORDER — HYDROCODONE-ACETAMINOPHEN 7.5-325 MG PO TABS
1.0000 | ORAL_TABLET | Freq: Four times a day (QID) | ORAL | Status: DC | PRN
  Administered 2011-03-10: 1 via ORAL

## 2011-03-10 MED ORDER — MIDAZOLAM HCL 5 MG/5ML IJ SOLN
INTRAMUSCULAR | Status: DC | PRN
  Administered 2011-03-10: 1 mg via INTRAVENOUS

## 2011-03-10 MED ORDER — SODIUM CHLORIDE 0.9 % IV SOLN: INTRAVENOUS | Status: DC

## 2011-03-10 MED ORDER — FENTANYL CITRATE 0.05 MG/ML IJ SOLN: 25.0000 ug | INTRAMUSCULAR | Status: DC | PRN

## 2011-03-10 MED ORDER — FENTANYL CITRATE 0.05 MG/ML IJ SOLN
INTRAMUSCULAR | Status: DC | PRN
  Administered 2011-03-10 (×2): 50 ug via INTRAVENOUS
  Administered 2011-03-10 (×3): 25 ug via INTRAVENOUS
  Administered 2011-03-10 (×2): 50 ug via INTRAVENOUS
  Administered 2011-03-10: 25 ug via INTRAVENOUS

## 2011-03-10 MED ORDER — LIDOCAINE HCL (CARDIAC) 20 MG/ML IV SOLN
INTRAVENOUS | Status: DC | PRN
  Administered 2011-03-10: 60 mg via INTRAVENOUS

## 2011-03-10 MED ORDER — STERILE WATER FOR IRRIGATION IR SOLN
Status: DC | PRN
  Administered 2011-03-10: 3000 mL

## 2011-03-10 MED ORDER — DEXAMETHASONE SODIUM PHOSPHATE 4 MG/ML IJ SOLN
INTRAMUSCULAR | Status: DC | PRN
  Administered 2011-03-10: 4 mg via INTRAVENOUS

## 2011-03-10 MED ORDER — STERILE WATER FOR IRRIGATION IR SOLN
Status: DC | PRN
  Administered 2011-03-10: 12 mL

## 2011-03-10 MED ORDER — LACTATED RINGERS IV SOLN
INTRAVENOUS | Status: DC
  Administered 2011-03-10 (×3): via INTRAVENOUS

## 2011-03-10 MED ORDER — HYDROCODONE-ACETAMINOPHEN 7.5-325 MG PO TABS: 1.0000 | ORAL_TABLET | ORAL | Status: AC | PRN

## 2011-03-10 MED ORDER — CIPROFLOXACIN HCL 500 MG PO TABS: 500.0000 mg | ORAL_TABLET | Freq: Two times a day (BID) | ORAL | Status: AC

## 2011-03-10 MED ORDER — SODIUM CHLORIDE 0.9 % IV SOLN: 500.0000 mL | INTRAVENOUS | Status: DC

## 2011-03-10 MED ORDER — PROPOFOL 10 MG/ML IV EMUL
INTRAVENOUS | Status: DC | PRN
  Administered 2011-03-10: 200 mg via INTRAVENOUS

## 2011-03-10 MED ORDER — CIPROFLOXACIN IN D5W 400 MG/200ML IV SOLN
400.0000 mg | Freq: Two times a day (BID) | INTRAVENOUS | Status: DC
  Administered 2011-03-10: 400 mg via INTRAVENOUS

## 2011-03-10 MED ORDER — IOHEXOL 350 MG/ML SOLN
INTRAVENOUS | Status: DC | PRN
  Administered 2011-03-10: 7 mL

## 2011-03-10 MED ORDER — PROMETHAZINE HCL 25 MG/ML IJ SOLN: 6.2500 mg | INTRAMUSCULAR | Status: DC | PRN

## 2011-03-10 MED ORDER — DEXTROSE 5 % IV SOLN: 500.0000 mL | INTRAVENOUS | Status: DC

## 2011-03-10 MED ORDER — ONDANSETRON HCL 4 MG/2ML IJ SOLN
INTRAMUSCULAR | Status: DC | PRN
  Administered 2011-03-10: 4 mg via INTRAVENOUS

## 2011-03-10 SURGICAL SUPPLY — 32 items
BAG URINE DRAINAGE (UROLOGICAL SUPPLIES) ×2 IMPLANT
BLADE SURG ROTATE 9660 (MISCELLANEOUS) ×2 IMPLANT
CATH FOLEY 2WAY SLVR  5CC 16FR (CATHETERS) ×2
CATH FOLEY 2WAY SLVR 5CC 16FR (CATHETERS) ×2 IMPLANT
CATH ROBINSON RED A/P 20FR (CATHETERS) ×2 IMPLANT
CLOTH BEACON ORANGE TIMEOUT ST (SAFETY) ×2 IMPLANT
COVER MAYO STAND STRL (DRAPES) ×2 IMPLANT
COVER TABLE BACK 60X90 (DRAPES) ×2 IMPLANT
DRSG TEGADERM 4X4.75 (GAUZE/BANDAGES/DRESSINGS) ×2 IMPLANT
DRSG TEGADERM 8X12 (GAUZE/BANDAGES/DRESSINGS) ×2 IMPLANT
GAUZE SPONGE 4X4 12PLY STRL LF (GAUZE/BANDAGES/DRESSINGS) ×1 IMPLANT
GLOVE BIO SURGEON STRL SZ7.5 (GLOVE) IMPLANT
GLOVE BIO SURGEON STRL SZ8 (GLOVE) ×4 IMPLANT
GLOVE BIOGEL M 7.0 STRL (GLOVE) ×1 IMPLANT
GLOVE BIOGEL PI IND STRL 7.5 (GLOVE) IMPLANT
GLOVE BIOGEL PI INDICATOR 7.5 (GLOVE) ×1
GLOVE ECLIPSE 8.0 STRL XLNG CF (GLOVE) ×4 IMPLANT
GLOVE INDICATOR 6.5 STRL GRN (GLOVE) ×2 IMPLANT
GOWN STRL NON-REIN LRG LVL3 (GOWN DISPOSABLE) ×1 IMPLANT
GOWN STRL REIN XL XLG (GOWN DISPOSABLE) ×2 IMPLANT
GOWN XL W/COTTON TOWEL STD (GOWNS) ×2 IMPLANT
HOLDER FOLEY CATH W/STRAP (MISCELLANEOUS) ×2 IMPLANT
IV NS IRRIG 3000ML ARTHROMATIC (IV SOLUTION) IMPLANT
PACK CYSTOSCOPY (CUSTOM PROCEDURE TRAY) ×2 IMPLANT
Prostate seeds ×72 IMPLANT
SYR CONTROL 10ML LL (SYRINGE) ×1 IMPLANT
SYRINGE 10CC LL (SYRINGE) ×3 IMPLANT
SYRINGE IRR TOOMEY STRL 70CC (SYRINGE) ×1 IMPLANT
TOWEL OR 17X24 6PK STRL BLUE (TOWEL DISPOSABLE) ×1 IMPLANT
UNDERPAD 30X30 INCONTINENT (UNDERPADS AND DIAPERS) ×4 IMPLANT
WATER STERILE IRR 3000ML UROMA (IV SOLUTION) ×1 IMPLANT
WATER STERILE IRR 500ML POUR (IV SOLUTION) ×2 IMPLANT

## 2011-03-10 NOTE — Transfer of Care (Signed)
Immediate Anesthesia Transfer of Care Note  Patient: Corey Avila  Procedure(s) Performed:  RADIOACTIVE SEED IMPLANT - rad tech of per vickie at main or  Patient Location: Patient transported to PACU with oxygen via face mask at 6 Liters / Min  Anesthesia Type: General  Level of Consciousness: awake and alert   Airway & Oxygen Therapy: Patient Spontanous Breathing and Patient connected to face mask oxygen Post-op Assessment: Report given to PACU RN and Post -op Vital signs reviewed and stable  Post vital signs: Reviewed and stable  Complications: No apparent anesthesia complications

## 2011-03-10 NOTE — Progress Notes (Signed)
  Radiation Oncology         [phone] ________________________________  Name: Corey Avila MRN: [id number]  Date: 03/10/2011  DOB: Feb 13, 1946       Prostate Seed Implant  CC:  Dr. Ihor Gully  DIAGNOSIS: A 65 year old gentlemen with stage T1 C. adenocarcinoma of the prostate with a Gleason of 3+4 and a PSA of 5.7.  PROCEDURE: Insertion of radioactive I-125 seeds into the prostate gland.  RADIATION DOSE: 110 Gy, definitive/boost therapy.  TECHNIQUE: Corey Avila was brought to the operating room with the urologist. He was placed in the dorsolithotomy position. He was catheterized and a rectal tube was inserted. The perineum was shaved, prepped and draped. The ultrasound probe was then introduced into the rectum to see the prostate gland.  TREATMENT DEVICE: A needle grid was attached to the ultrasound probe stand and anchor needles were placed.  COMPLEX ISODOSE CALCULATION: The prostate was imaged in 3D using a sagittal sweep of the prostate probe. These images were transferred to the planning computer. There, the prostate, urethra and rectum were defined on each axial reconstructed image. Then, the software created an optimized plan and a few seed positions were adjusted. Then the accepted plan was uploaded to the seed Selectron afterloading unit.  SPECIAL TREATMENT PROCEDURE/SUPERVISION AND HANDLING: The Nucletron FIRST system was used to place the needles under sagittal guidance. A total of 18 needles were used to deposit 72 seeds in the prostate gland. The individual seed activity was 0.384 mCi.  COMPLEX SIMULATION: At the end of the procedure, an anterior radiograph of the pelvis was obtained to document seed positioning and count. Cystoscopy was performed to check the urethra and bladder.  MICRODOSIMETRY: At the end of the procedure, the patient was emitting 0.1 mrem/hr at 1 meter. Accordingly, he was considered safe for hospital discharge.  PLAN: The patient will return  to the radiation oncology clinic for post implant CT dosimetry in three weeks.   ________________________________  Artist Pais Kathrynn Running, M.D.

## 2011-03-10 NOTE — Anesthesia Procedure Notes (Addendum)
Procedure Name: LMA Insertion Date/Time: 03/10/2011 11:08 AM Performed by: Lorrin Jackson Pre-anesthesia Checklist: Patient identified, Emergency Drugs available, Suction available and Patient being monitored Patient Re-evaluated:Patient Re-evaluated prior to inductionOxygen Delivery Method: Circle System Utilized Preoxygenation: Pre-oxygenation with 100% oxygen Intubation Type: IV induction Ventilation: Mask ventilation without difficulty LMA: LMA inserted LMA Size: 5.0 Number of attempts: 2 Airway Equipment and Method: bite block Placement Confirmation: positive ETCO2 Tube secured with: Tape Dental Injury: Teeth and Oropharynx as per pre-operative assessment

## 2011-03-10 NOTE — Anesthesia Postprocedure Evaluation (Signed)
  Anesthesia Post-op Note  Patient: Corey Avila  Procedure(s) Performed:  RADIOACTIVE SEED IMPLANT - rad tech of per vickie at main or  Patient Location: PACU  Anesthesia Type: General  Level of Consciousness: awake and alert   Airway and Oxygen Therapy: Patient Spontanous Breathing  Post-op Pain: mild  Post-op Assessment: Post-op Vital signs reviewed, Patient's Cardiovascular Status Stable, Respiratory Function Stable, Patent Airway and No signs of Nausea or vomiting  Post-op Vital Signs: stable  Complications: No apparent anesthesia complications

## 2011-03-10 NOTE — Progress Notes (Signed)
Reported to M. Melvyn Neth, RN as caregiver

## 2011-03-10 NOTE — Progress Notes (Signed)
Pt and his wife taught how to empty drainage bag and how to remove foley catheter in AM

## 2011-03-10 NOTE — H&P (Signed)
History of Present Illness     Corey Avila is a 65 year old male patient of Dr. Clovis Riley who was seen in office consultation today for further evaluation of an elevated PSA. PSAs: 6/11 - 4.11 12/11 - 5.7/10% 6/12 - 4.3/13.5%   He reports no prior history of prostatitis or UTI. He said occasionally he will have some frequency and has noted a slight decrease in the force of his urinary stream but otherwise he has no other voiding complaints. He denies any hematuria or dysuria. He has a strong family history of prostate cancer and indicates that his father was diagnosed with prostate cancer in his 3s and he has a brother who was also diagnosed with prostate cancer at the age of 27.   Procedure: TRUS/BX on 11/04/10 revealed a 45.5 cc prostate. His pathology revealed adenocarcinoma of the prostate with 7/12 cores pos. (3+3 = 6 in 5 cores and 3+4 = 2 in 2 cores).   Past Medical History Problems  1. History of  Hypertension 401.9  Surgical History Problems  1. History of  Appendectomy 2. History of  Cholecystectomy  Current Meds 1. AmLODIPine Besylate 10 MG Oral Tablet; Therapy: (Recorded:27Jun2012) to 2. Calcium + D TABS; Therapy: (Recorded:27Jun2012) to 3. Grape Seed Xtra CAPS; Therapy: (Recorded:27Jun2012) to 4. Multi-Vitamin TABS; Therapy: (Recorded:27Jun2012) to 5. Saw Palmetto TABS; Therapy: (Recorded:27Jun2012) to 6. Vitamin E 400 UNIT Oral Capsule; Therapy: (Recorded:27Jun2012) to 7. Zinc 15 MG TABS; Therapy: (Recorded:27Jun2012) to  Allergies Medication  1. No Known Drug Allergies  Family History Problems  1. Paternal history of  Prostate Cancer V16.42 2. Fraternal history of  Prostate Cancer V16.42 3. Maternal uncle's history of  Prostate Cancer V16.42 4. Maternal uncle's history of  Prostate Cancer V16.42  Social History Problems    Alcohol Use   Caffeine Use   Marital History - Currently Married   Never A Smoker   Occupation: Denied    History of  Tobacco  Use  Review of Systems Genitourinary, constitutional, skin, eye, otolaryngeal, hematologic/lymphatic, cardiovascular, pulmonary, endocrine, musculoskeletal, gastrointestinal, neurological and psychiatric system(s) were reviewed and pertinent findings if present are noted.  Genitourinary: urinary frequency, feelings of urinary urgency, nocturia, weak urinary stream and erectile dysfunction.    Vitals Vital Signs [Data Includes: Last 1 Day]   BMI Calculated: 34.36 BSA Calculated: 2.26 Height: 5 ft 10 in Weight: 240 lb  Blood Pressure: 128 / 73 Heart Rate: 60 Respiration: 12  Physical Exam Constitutional: Well nourished and well developed . No acute distress.  ENT:. The ears and nose are normal in appearance.  Neck: The appearance of the neck is normal and no neck mass is present.  Pulmonary: No respiratory distress and normal respiratory rhythm and effort.  Cardiovascular: Heart rate and rhythm are normal . No peripheral edema.  Abdomen: The abdomen is soft and nontender. No masses are palpated. No CVA tenderness. No hernias are palpable. No hepatosplenomegaly noted.  Rectal: Rectal exam demonstrates normal sphincter tone, no tenderness and no masses. Estimated prostate size is 1+. The prostate has no nodularity and is not tender. The left seminal vesicle is nonpalpable. The right seminal vesicle is nonpalpable. The perineum is normal on inspection.  Genitourinary: Examination of the penis demonstrates no discharge, no masses, no lesions and a normal meatus. The penis is uncircumcised. The scrotum is without lesions. The right epididymis is palpably normal and non-tender. The left epididymis is palpably normal and non-tender. The right testis is non-tender and without masses. The left testis is non-tender  and without masses.  Lymphatics: The femoral and inguinal nodes are not enlarged or tender.  Skin: Normal skin turgor, no visible rash and no visible skin lesions.  Neuro/Psych:. Mood and  affect are appropriate.    Results/Data  Impression: Adenocarcinoma of the prostate. The patient has elected to proceed with external beam radiation therapy followed by radioactive seed boost.He had fiducial markers placed and has undergone his XRT. He presents today for radioactive seed implantation.  Plan:Radioactive seed implantation.

## 2011-03-10 NOTE — Op Note (Signed)
PATIENT:  Corey Avila  PRE-OPERATIVE DIAGNOSIS:  Adenocarcinoma of the prostate  POST-OPERATIVE DIAGNOSIS:  Same  PROCEDURE:  Procedure(s): 1. I-125 radioactive seed implantation 2. Cystoscopy  SURGEON:  Surgeon(s): Garnett Farm  Radiation oncologist: Dr. Margaretmary Dys  ANESTHESIA:  General  EBL:  Minimal  DRAINS: 16 French Foley catheter  LOCAL MEDICATIONS USED:  None  SPECIMEN: None  DISPOSITION OF SPECIMEN:  N/A  Description of procedure: After informed consent the patient was brought to the major OR, placed on the table and administered general anesthesia. He was then moved to the modified lithotomy position with his perineum perpendicular to the floor. His perineum and genitalia were then sterilely prepped. An official timeout was then performed. A 16 French Foley catheter was then placed in the bladder and filled with dilute contrast, a rectal tube was placed in the rectum and the transrectal ultrasound probe was placed in the rectum and affixed to the stand. He was then sterilely draped.  Real time ultrasonography was used along with the seed planning software spot-pro version 3.1-00. This was used to develop the seed plan including the number of needles as well as number of seeds required for complete and adequate coverage. Real-time ultrasonography was then used along with the previously developed plan and the Nucletron device to implant a total of 72 seeds using 20 needles. This proceeded without difficulty or complication.  A Foley catheter was then removed as well as the transrectal ultrasound probe and rectal probe. Flexible cystoscopy was then performed using the 17 French flexible scope which revealed a normal urethra throughout its length down to the sphincter which appeared intact. The prostatic urethra revealed bilobar hypertrophy but no evidence of obstruction, seeds, spacers or lesions. The bladder was then entered and fully and systematically inspected. The  ureteral orifices were noted to be of normal configuration and position. The mucosa revealed no evidence of tumors. There were also no stones identified within the bladder. I noted no seeds or spacers on the floor of the bladder and retroflexion of the scope revealed no seeds protruding from the base of the prostate.  The cystoscope was then removed and a new 16 French Foley catheter was then inserted and the balloon was filled with 10 cc of sterile water. This was connected to closed system drainage and the patient was awakened and taken to recovery room in stable and satisfactory condition. He tolerated procedure well and there were no intraoperative complications.  PLAN OF CARE: Discharge to home after PACU  PATIENT DISPOSITION:  PACU - hemodynamically stable.

## 2011-03-10 NOTE — Anesthesia Preprocedure Evaluation (Addendum)
Anesthesia Evaluation  Patient identified by MRN, date of birth, ID band Patient awake    Reviewed: Allergy & Precautions, H&P , NPO status , Patient's Chart, lab work & pertinent test results  Airway Mallampati: II TM Distance: >3 FB Neck ROM: Full    Dental No notable dental hx. (+) Teeth Intact and Chipped   Pulmonary neg pulmonary ROS,  clear to auscultation  Pulmonary exam normal       Cardiovascular hypertension, neg cardio ROS Regular Normal    Neuro/Psych Negative Neurological ROS  Negative Psych ROS   GI/Hepatic negative GI ROS, Neg liver ROS,   Endo/Other  Negative Endocrine ROSMorbid obesity  Renal/GU negative Renal ROS  Genitourinary negative   Musculoskeletal negative musculoskeletal ROS (+)   Abdominal   Peds negative pediatric ROS (+)  Hematology negative hematology ROS (+)   Anesthesia Other Findings   Reproductive/Obstetrics negative OB ROS                        Anesthesia Physical Anesthesia Plan  ASA: II  Anesthesia Plan: General   Post-op Pain Management:    Induction: Intravenous  Airway Management Planned: LMA  Additional Equipment:   Intra-op Plan:   Post-operative Plan:   Informed Consent:   Dental advisory given  Plan Discussed with: CRNA  Anesthesia Plan Comments:         Anesthesia Quick Evaluation

## 2011-03-17 ENCOUNTER — Encounter (HOSPITAL_BASED_OUTPATIENT_CLINIC_OR_DEPARTMENT_OTHER): Payer: Self-pay | Admitting: Urology

## 2011-03-17 NOTE — Addendum Note (Signed)
Addendum  created 03/17/11 1217 by Lorrin Jackson   Modules edited:Anesthesia Responsible Staff

## 2011-03-17 NOTE — Addendum Note (Signed)
Addendum  created 03/17/11 1217 by Linell Meldrum   Modules edited:Anesthesia Responsible Staff    

## 2011-03-27 ENCOUNTER — Telehealth: Payer: Self-pay | Admitting: *Deleted

## 2011-03-28 ENCOUNTER — Ambulatory Visit
Admission: RE | Admit: 2011-03-28 | Discharge: 2011-03-28 | Disposition: A | Discharge status: Home / Self Care | Payer: BC Managed Care – PPO | Source: Ambulatory Visit | Attending: Radiation Oncology | Admitting: Radiation Oncology

## 2011-03-28 ENCOUNTER — Encounter: Payer: Self-pay | Admitting: Radiation Oncology

## 2011-03-28 VITALS — BP 130/79 | HR 70 | Temp 97.5°F | Resp 20 | Ht 70.0 in | Wt 255.8 lb

## 2011-03-28 DIAGNOSIS — I10 Essential (primary) hypertension: Secondary | ICD-10-CM | POA: Insufficient documentation

## 2011-03-28 DIAGNOSIS — Z7982 Long term (current) use of aspirin: Secondary | ICD-10-CM | POA: Insufficient documentation

## 2011-03-28 DIAGNOSIS — C61 Malignant neoplasm of prostate: Secondary | ICD-10-CM | POA: Insufficient documentation

## 2011-03-28 DIAGNOSIS — Z510 Encounter for antineoplastic radiation therapy: Secondary | ICD-10-CM | POA: Insufficient documentation

## 2011-03-28 DIAGNOSIS — Z79899 Other long term (current) drug therapy: Secondary | ICD-10-CM | POA: Insufficient documentation

## 2011-03-28 DIAGNOSIS — R350 Frequency of micturition: Secondary | ICD-10-CM | POA: Insufficient documentation

## 2011-03-28 HISTORY — DX: Malignant neoplasm of prostate: C61

## 2011-03-28 NOTE — Progress Notes (Signed)
  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    Narrative . . . . . . . . The patient returns today for routine follow-up.  He awakens with nocturia, more frequently than he did prior to prostate seed implant. For the first few weeks, he awakened every hour. This is gradually improving. He denies any dysuria. He denies any rectal complications. He reports some fatigue after work.                             ALLERGIES:   has no known allergies.  Meds. . . . . . . . . . . . Current Outpatient Prescriptions  Medication Sig Dispense Refill  . amLODipine (NORVASC) 10 MG tablet Take 10 mg by mouth daily.          Physical Findings. . .  height is 5\' 10"  (1.778 m) and weight is 255 lb 12.8 oz (116.03 kg). His oral temperature is 97.5 F (36.4 C). His blood pressure is 130/79 and his pulse is 70. His respiration is 20. Marland Kitchen  No significant changes.  Lab Findings . . . . .  Lab Results  Component Value Date   WBC 5.1 03/04/2011   HGB 13.4 03/04/2011   HCT 39.5 03/04/2011   MCV 90.4 03/04/2011   PLT 162 03/04/2011   X-Ray Findings. . . . the patient underwent CT in our clinic today. The axial images appeared to show uniform distribution of radioactive seeds throughout the prostate on each slice. This should correlate with acceptable dosimetry once 3-D planning has been performed with the study set.  Impression . . . . . . . The patient is recovering from the effects of radiation.  Plan . . . . . . . . . . . Marland Kitchen the patient will continue to follow with Dr. Ihor Gully for ongoing PSA determinations. We spent time today talking about some long-term effects related to  radiation and brachytherapy for prostate cancer. At about the potential for rectal issues as well as erectile dysfunction. The patient will plan to contact her office or return at any point in the future if he has questions or concerns related to radiation treatment. Otherwise, he'll followup in radiation clinic on an as needed basis  _____________________________________  Artist Pais. Kathrynn Running, M.D.

## 2011-03-28 NOTE — Progress Notes (Signed)
Pt reports urinary freq, dysuria, urgency , noctuira q 1hr which he states he had before implant, but these conditions are gradually improving. Bm's normal.  Some fatigue after work.

## 2011-05-15 ENCOUNTER — Ambulatory Visit
Admission: RE | Admit: 2011-05-15 | Discharge: 2011-05-15 | Disposition: A | Discharge status: Home / Self Care | Payer: BC Managed Care – PPO | Source: Ambulatory Visit | Attending: Radiation Oncology | Admitting: Radiation Oncology

## 2011-05-15 DIAGNOSIS — C61 Malignant neoplasm of prostate: Secondary | ICD-10-CM | POA: Insufficient documentation

## 2011-05-21 ENCOUNTER — Encounter: Payer: Self-pay | Admitting: Radiation Oncology

## 2011-05-21 NOTE — Progress Notes (Signed)
   Department of Radiation Oncology  Phone:  [phone] Fax:        [phone]   3-D Planning Note Prostate Brachytherapy  Name: Corey Avila MRN: [id number] DOB: December 07, 1945  CC:  Corey Gully, MD  Diagnosis: 66 year old gentleman with stage T1c. adenocarcinoma of the prostate with a Gleason's score of 3+4 and PSA of 5.7  Narrative: Corey Avila returned following prostate seed implantation for post implant planning. He underwent CT scan to delineate the three-dimensional structures of the pelvis and demonstrate the radiation distribution.  Results:   Prostate Coverage - The dose of radiation delivered to the 90% or more of the prostate gland (D90) was 102.86% of the prescription dose. This exceeds our goal of greater than 90%. Rectal Sparing - The volume of rectal tissue receiving the prescription dose or higher was 0.25 cc. This falls under our thresholds tolerance of 1.0 cc.  Impression: Corey Avila prostate seed implant appears to show adequate target coverage and appropriate rectal sparing.  Plan:  The patient will continue to follow with urology for ongoing PSA determinations. I would anticipate a high likelihood for local tumor control with minimal risk for rectal morbidity.  ------------------------------------------------  Artist Pais. Kathrynn Running, M.D.

## 2011-06-06 NOTE — Telephone Encounter (Signed)
xxx

## 2011-06-06 NOTE — Telephone Encounter (Signed)
XXXX 

## 2012-05-20 ENCOUNTER — Other Ambulatory Visit: Payer: Self-pay | Admitting: Gastroenterology

## 2015-06-06 ENCOUNTER — Encounter: Payer: Self-pay | Admitting: Gastroenterology

## 2017-12-17 DIAGNOSIS — H6692 Otitis media, unspecified, left ear: Secondary | ICD-10-CM | POA: Diagnosis not present

## 2017-12-17 DIAGNOSIS — H7292 Unspecified perforation of tympanic membrane, left ear: Secondary | ICD-10-CM | POA: Diagnosis not present

## 2017-12-17 DIAGNOSIS — I10 Essential (primary) hypertension: Secondary | ICD-10-CM | POA: Diagnosis not present

## 2017-12-29 DIAGNOSIS — Z23 Encounter for immunization: Secondary | ICD-10-CM | POA: Diagnosis not present

## 2017-12-29 DIAGNOSIS — I10 Essential (primary) hypertension: Secondary | ICD-10-CM | POA: Diagnosis not present

## 2017-12-29 DIAGNOSIS — Z6835 Body mass index (BMI) 35.0-35.9, adult: Secondary | ICD-10-CM | POA: Diagnosis not present

## 2017-12-29 DIAGNOSIS — Z1159 Encounter for screening for other viral diseases: Secondary | ICD-10-CM | POA: Diagnosis not present

## 2017-12-29 DIAGNOSIS — E7800 Pure hypercholesterolemia, unspecified: Secondary | ICD-10-CM | POA: Diagnosis not present

## 2017-12-29 DIAGNOSIS — E119 Type 2 diabetes mellitus without complications: Secondary | ICD-10-CM | POA: Diagnosis not present

## 2017-12-29 DIAGNOSIS — Z8546 Personal history of malignant neoplasm of prostate: Secondary | ICD-10-CM | POA: Diagnosis not present

## 2018-08-31 DIAGNOSIS — I10 Essential (primary) hypertension: Secondary | ICD-10-CM | POA: Diagnosis not present

## 2018-08-31 DIAGNOSIS — E7800 Pure hypercholesterolemia, unspecified: Secondary | ICD-10-CM | POA: Diagnosis not present

## 2018-08-31 DIAGNOSIS — E119 Type 2 diabetes mellitus without complications: Secondary | ICD-10-CM | POA: Diagnosis not present

## 2018-09-03 DIAGNOSIS — E7800 Pure hypercholesterolemia, unspecified: Secondary | ICD-10-CM | POA: Diagnosis not present

## 2018-09-03 DIAGNOSIS — E119 Type 2 diabetes mellitus without complications: Secondary | ICD-10-CM | POA: Diagnosis not present

## 2018-09-03 DIAGNOSIS — E1165 Type 2 diabetes mellitus with hyperglycemia: Secondary | ICD-10-CM | POA: Diagnosis not present

## 2019-03-03 DIAGNOSIS — E119 Type 2 diabetes mellitus without complications: Secondary | ICD-10-CM | POA: Diagnosis not present

## 2019-03-03 DIAGNOSIS — E7800 Pure hypercholesterolemia, unspecified: Secondary | ICD-10-CM | POA: Diagnosis not present

## 2019-03-03 DIAGNOSIS — I10 Essential (primary) hypertension: Secondary | ICD-10-CM | POA: Diagnosis not present

## 2019-03-04 DIAGNOSIS — E119 Type 2 diabetes mellitus without complications: Secondary | ICD-10-CM | POA: Diagnosis not present

## 2019-06-16 ENCOUNTER — Ambulatory Visit: Payer: BC Managed Care – PPO | Attending: Internal Medicine

## 2019-06-16 DIAGNOSIS — Z23 Encounter for immunization: Secondary | ICD-10-CM

## 2019-06-16 NOTE — Progress Notes (Signed)
   Covid-19 Vaccination Clinic  Name:  TAN CHRETIEN    MRN: [id number] DOB: 1946/03/08  06/16/2019  Mr. Frankhouser was observed post Covid-19 immunization for 15 minutes without incident. He was provided with Vaccine Information Sheet and instruction to access the V-Safe system.   Mr. Heyse was instructed to call 911 with any severe reactions post vaccine: Marland Kitchen Difficulty breathing  . Swelling of face and throat  . A fast heartbeat  . A bad rash all over body  . Dizziness and weakness   Immunizations Administered    Name Date Dose VIS Date Route   Pfizer COVID-19 Vaccine 06/16/2019  8:48 AM 0.3 mL 03/25/2019 Intramuscular   Manufacturer: [place]   Lot: [id number]   [place]: [id number]

## 2019-07-11 ENCOUNTER — Ambulatory Visit: Payer: BC Managed Care – PPO | Attending: Internal Medicine

## 2019-07-11 DIAGNOSIS — Z23 Encounter for immunization: Secondary | ICD-10-CM

## 2019-07-11 NOTE — Progress Notes (Signed)
   Covid-19 Vaccination Clinic  Name:  EVIN CHADDOCK    MRN: [id number] DOB: 18-Mar-1946  07/11/2019  Mr. Rosenboom was observed post Covid-19 immunization for 15 minutes without incident. He was provided with Vaccine Information Sheet and instruction to access the V-Safe system.   Mr. Steffensmeier was instructed to call 911 with any severe reactions post vaccine: Marland Kitchen Difficulty breathing  . Swelling of face and throat  . A fast heartbeat  . A bad rash all over body  . Dizziness and weakness   Immunizations Administered    Name Date Dose VIS Date Route   Pfizer COVID-19 Vaccine 07/11/2019 12:04 PM 0.3 mL 03/25/2019 Intramuscular   Manufacturer: [place]   Lot: [id number]   [place]: [id number]

## 2019-07-13 ENCOUNTER — Ambulatory Visit: Payer: BC Managed Care – PPO

## 2019-09-02 DIAGNOSIS — I10 Essential (primary) hypertension: Secondary | ICD-10-CM | POA: Diagnosis not present

## 2019-09-02 DIAGNOSIS — E7800 Pure hypercholesterolemia, unspecified: Secondary | ICD-10-CM | POA: Diagnosis not present

## 2019-09-02 DIAGNOSIS — Z8546 Personal history of malignant neoplasm of prostate: Secondary | ICD-10-CM | POA: Diagnosis not present

## 2019-09-02 DIAGNOSIS — E1169 Type 2 diabetes mellitus with other specified complication: Secondary | ICD-10-CM | POA: Diagnosis not present

## 2020-01-19 DIAGNOSIS — Z23 Encounter for immunization: Secondary | ICD-10-CM | POA: Diagnosis not present

## 2020-04-05 ENCOUNTER — Other Ambulatory Visit: Payer: Self-pay | Admitting: Family Medicine

## 2020-04-05 DIAGNOSIS — Z0000 Encounter for general adult medical examination without abnormal findings: Secondary | ICD-10-CM | POA: Diagnosis not present

## 2020-04-05 DIAGNOSIS — E1169 Type 2 diabetes mellitus with other specified complication: Secondary | ICD-10-CM | POA: Diagnosis not present

## 2020-04-05 DIAGNOSIS — Z8546 Personal history of malignant neoplasm of prostate: Secondary | ICD-10-CM | POA: Diagnosis not present

## 2020-04-05 DIAGNOSIS — N5089 Other specified disorders of the male genital organs: Secondary | ICD-10-CM

## 2020-04-05 DIAGNOSIS — E7800 Pure hypercholesterolemia, unspecified: Secondary | ICD-10-CM | POA: Diagnosis not present

## 2020-04-05 DIAGNOSIS — I10 Essential (primary) hypertension: Secondary | ICD-10-CM | POA: Diagnosis not present

## 2020-04-24 ENCOUNTER — Ambulatory Visit
Admission: RE | Admit: 2020-04-24 | Discharge: 2020-04-24 | Disposition: A | Discharge status: Home / Self Care | Payer: Medicare Other | Source: Ambulatory Visit | Attending: Family Medicine | Admitting: Family Medicine

## 2020-04-24 DIAGNOSIS — N5089 Other specified disorders of the male genital organs: Secondary | ICD-10-CM

## 2020-04-24 DIAGNOSIS — N433 Hydrocele, unspecified: Secondary | ICD-10-CM | POA: Diagnosis not present

## 2020-10-05 DIAGNOSIS — E1169 Type 2 diabetes mellitus with other specified complication: Secondary | ICD-10-CM | POA: Diagnosis not present

## 2020-10-05 DIAGNOSIS — E7800 Pure hypercholesterolemia, unspecified: Secondary | ICD-10-CM | POA: Diagnosis not present

## 2020-10-05 DIAGNOSIS — I10 Essential (primary) hypertension: Secondary | ICD-10-CM | POA: Diagnosis not present

## 2021-06-20 IMAGING — US US SCROTUM
1 series · 14 of 25 positions shown · non-contrast
Comparison: None.

CLINICAL DATA: Enlargement of the left testicle

EXAM:
ULTRASOUND OF SCROTUM
TECHNIQUE: Complete ultrasound examination of the testicles, epididymis, and
other scrotal structures was performed.

[Series 1: us scrotum · 14 of 42 slices shown]
[im 1/42]
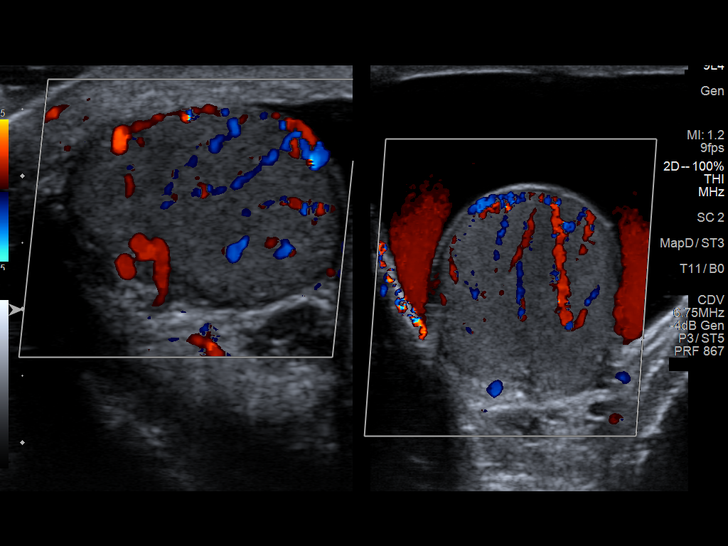
[im 4/42]
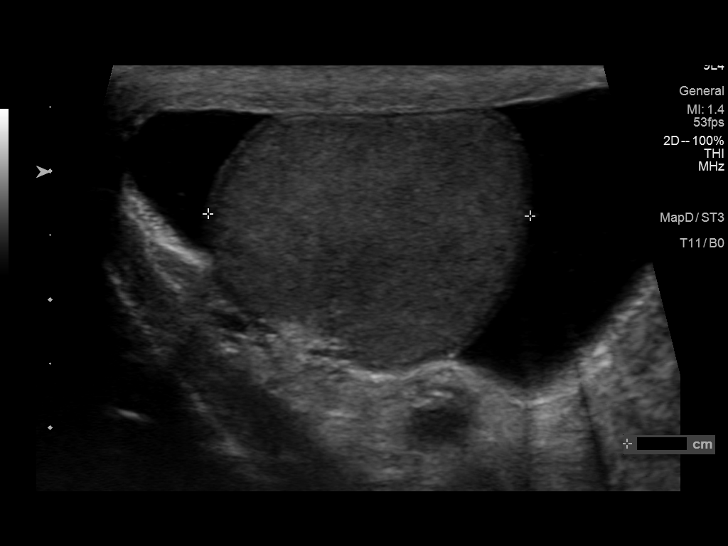
[im 7/42]
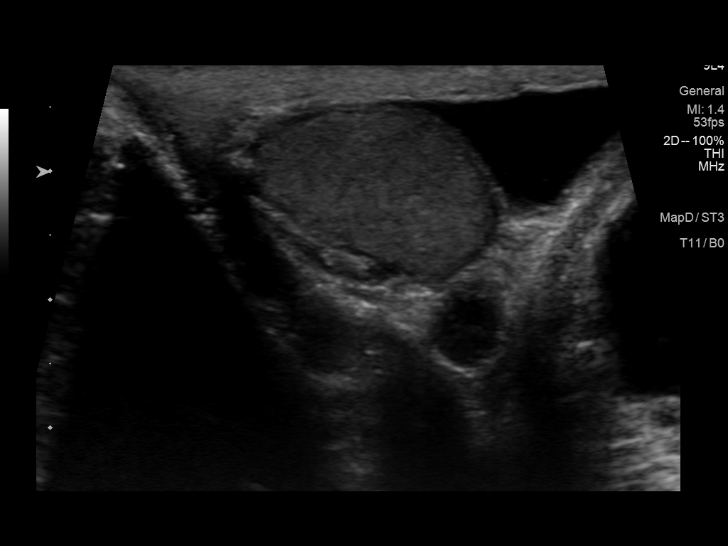
[im 11/42]
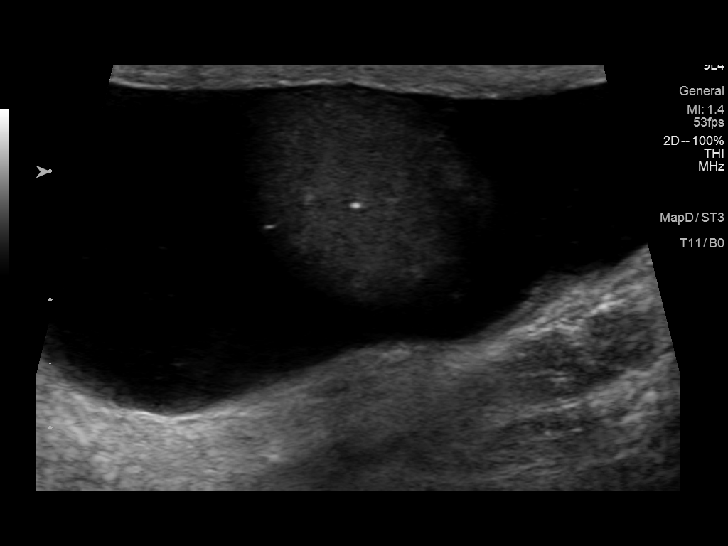
[im 14/42]
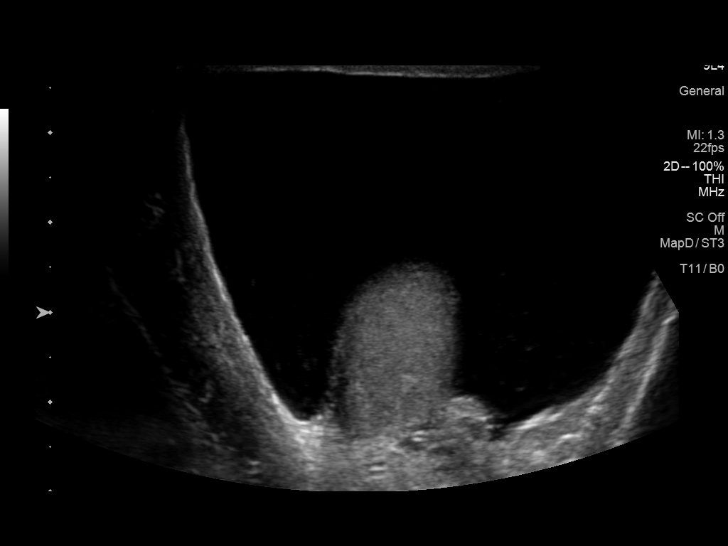
[im 16/42]
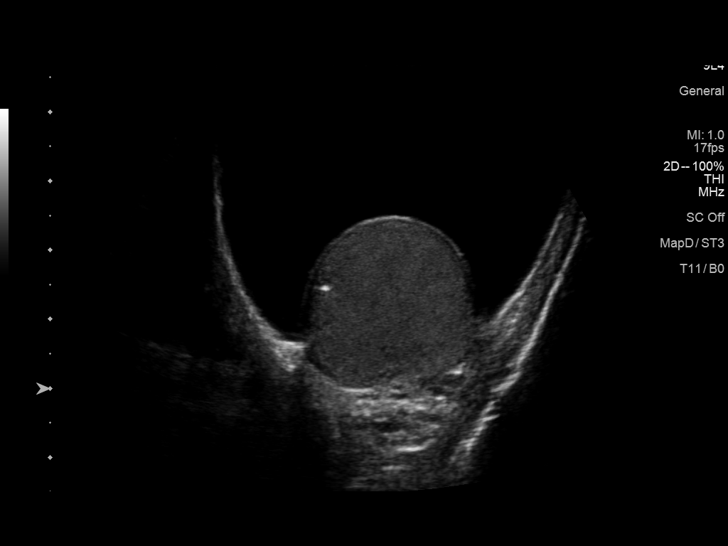
[im 19/42]
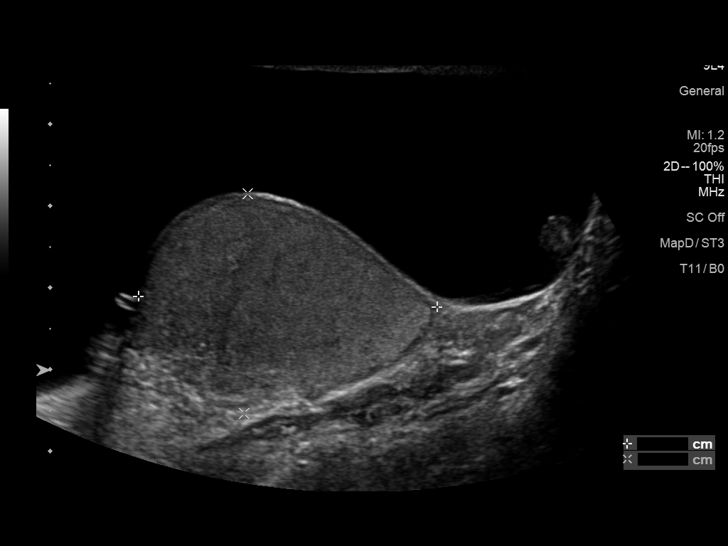
[im 23/42]
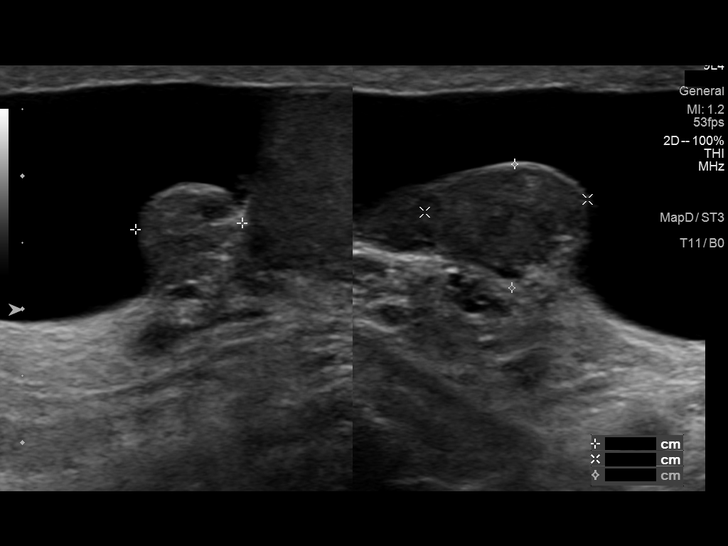
[im 26/42]
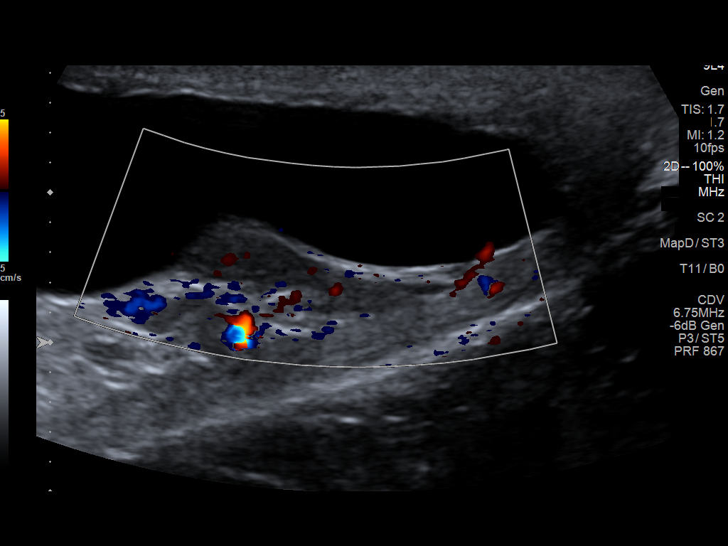
[im 28/42]
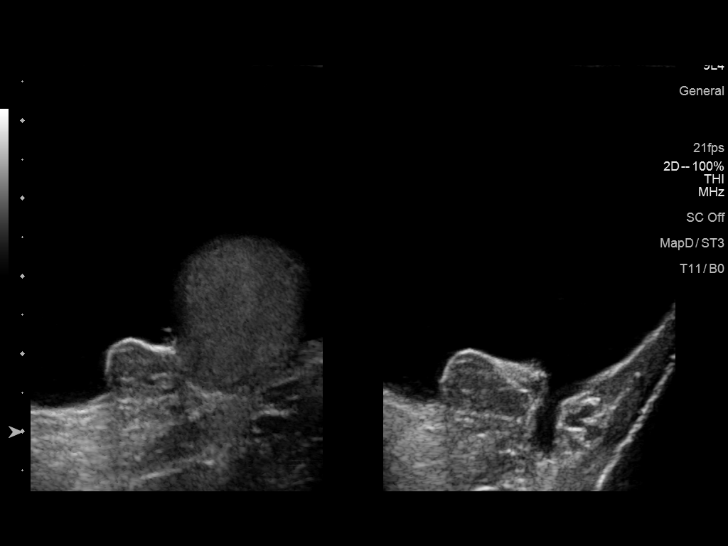
[im 31/42]
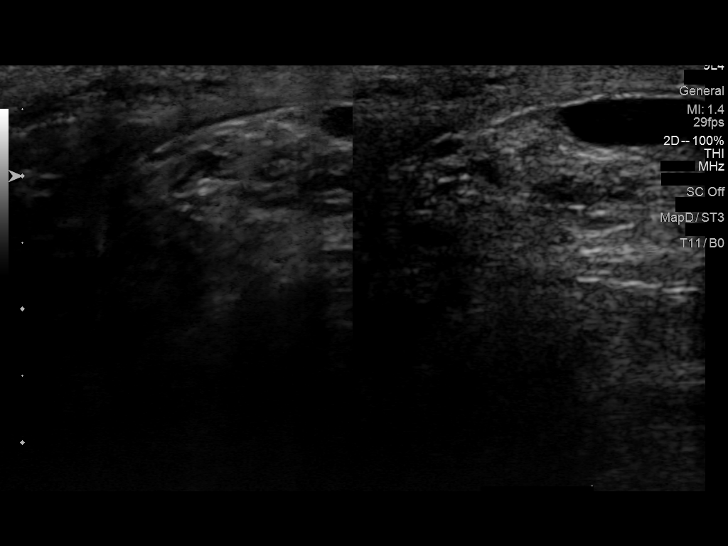
[im 35/42]
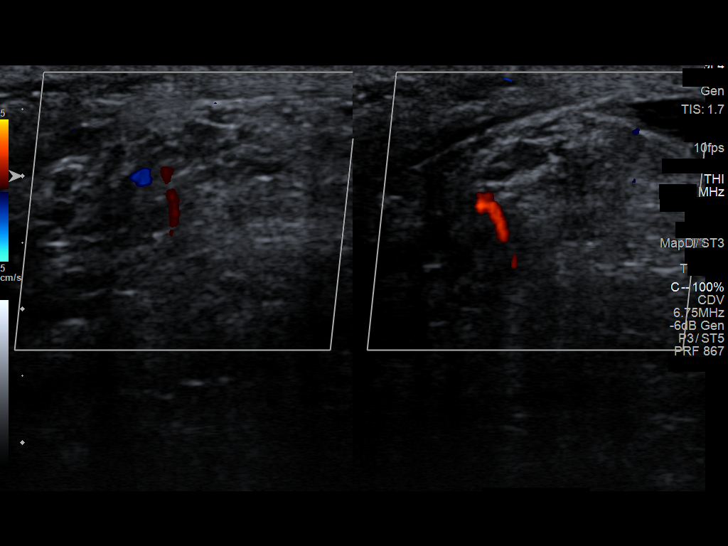
[im 38/42]
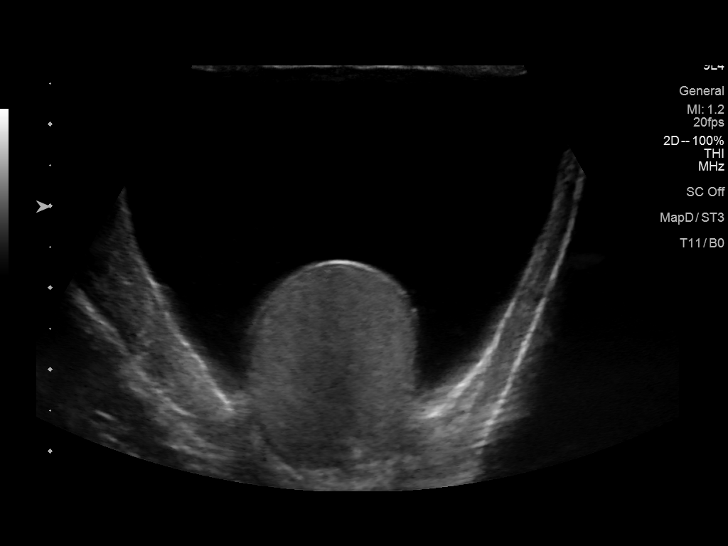
[im 42/42]
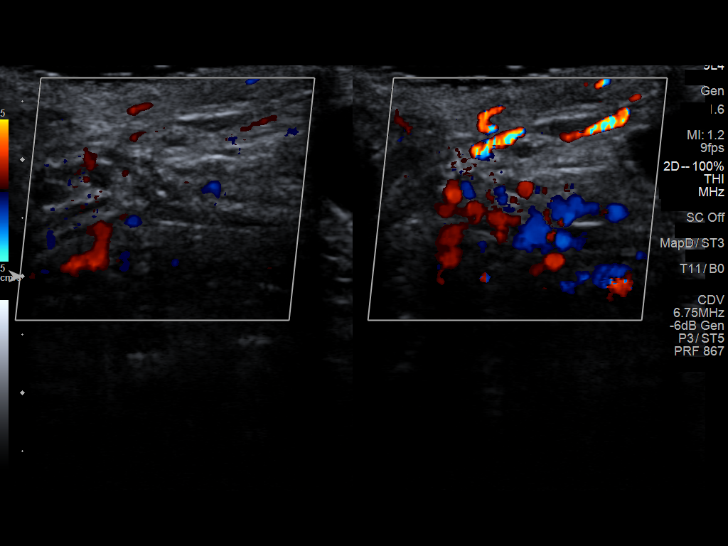

[14 of 25 positions shown; findings below may reference images not displayed]

FINDINGS: Right testicle

Measurements: 3.7 x 2.2 x 2.5 cm. There are few scattered
calcifications without evidence for a mass or definite
microlithiasis.

Left testicle

Measurements: 3.7 x 2.7 x 2.2 cm. Multiple calcifications are noted
without evidence for a distinct testicular mass.

Right epididymis:  Normal in size and appearance.

Left epididymis:  Normal in size and appearance.

Hydrocele: There are large bilateral hydroceles, left greater than
right.

Varicocele:  None visualized.
IMPRESSION: 1. No testicular mass.
2. Large bilateral hydroceles, left greater than right.

## 2021-10-17 DIAGNOSIS — E1169 Type 2 diabetes mellitus with other specified complication: Secondary | ICD-10-CM | POA: Diagnosis not present

## 2021-10-17 DIAGNOSIS — E7800 Pure hypercholesterolemia, unspecified: Secondary | ICD-10-CM | POA: Diagnosis not present

## 2021-10-17 DIAGNOSIS — Z23 Encounter for immunization: Secondary | ICD-10-CM | POA: Diagnosis not present

## 2021-10-17 DIAGNOSIS — I10 Essential (primary) hypertension: Secondary | ICD-10-CM | POA: Diagnosis not present

## 2021-10-17 DIAGNOSIS — E669 Obesity, unspecified: Secondary | ICD-10-CM | POA: Diagnosis not present

## 2022-05-05 DIAGNOSIS — Z8546 Personal history of malignant neoplasm of prostate: Secondary | ICD-10-CM | POA: Diagnosis not present

## 2022-05-05 DIAGNOSIS — Z0000 Encounter for general adult medical examination without abnormal findings: Secondary | ICD-10-CM | POA: Diagnosis not present

## 2022-05-05 DIAGNOSIS — E669 Obesity, unspecified: Secondary | ICD-10-CM | POA: Diagnosis not present

## 2022-05-05 DIAGNOSIS — Z1211 Encounter for screening for malignant neoplasm of colon: Secondary | ICD-10-CM | POA: Diagnosis not present

## 2022-05-05 DIAGNOSIS — E7800 Pure hypercholesterolemia, unspecified: Secondary | ICD-10-CM | POA: Diagnosis not present

## 2022-05-05 DIAGNOSIS — Z08 Encounter for follow-up examination after completed treatment for malignant neoplasm: Secondary | ICD-10-CM | POA: Diagnosis not present

## 2022-05-05 DIAGNOSIS — N1832 Chronic kidney disease, stage 3b: Secondary | ICD-10-CM | POA: Diagnosis not present

## 2022-05-05 DIAGNOSIS — E1169 Type 2 diabetes mellitus with other specified complication: Secondary | ICD-10-CM | POA: Diagnosis not present

## 2022-05-05 DIAGNOSIS — Z136 Encounter for screening for cardiovascular disorders: Secondary | ICD-10-CM | POA: Diagnosis not present

## 2022-05-05 DIAGNOSIS — I10 Essential (primary) hypertension: Secondary | ICD-10-CM | POA: Diagnosis not present

## 2022-05-20 DIAGNOSIS — E7800 Pure hypercholesterolemia, unspecified: Secondary | ICD-10-CM | POA: Diagnosis not present

## 2022-05-20 DIAGNOSIS — I10 Essential (primary) hypertension: Secondary | ICD-10-CM | POA: Diagnosis not present

## 2022-07-23 DIAGNOSIS — K644 Residual hemorrhoidal skin tags: Secondary | ICD-10-CM | POA: Diagnosis not present

## 2022-07-23 DIAGNOSIS — Z1211 Encounter for screening for malignant neoplasm of colon: Secondary | ICD-10-CM | POA: Diagnosis not present

## 2022-07-23 DIAGNOSIS — K648 Other hemorrhoids: Secondary | ICD-10-CM | POA: Diagnosis not present

## 2022-11-12 DIAGNOSIS — N1832 Chronic kidney disease, stage 3b: Secondary | ICD-10-CM | POA: Diagnosis not present

## 2022-11-12 DIAGNOSIS — K921 Melena: Secondary | ICD-10-CM | POA: Diagnosis not present

## 2022-11-12 DIAGNOSIS — K5900 Constipation, unspecified: Secondary | ICD-10-CM | POA: Diagnosis not present

## 2022-11-12 DIAGNOSIS — I10 Essential (primary) hypertension: Secondary | ICD-10-CM | POA: Diagnosis not present

## 2022-11-12 DIAGNOSIS — E1122 Type 2 diabetes mellitus with diabetic chronic kidney disease: Secondary | ICD-10-CM | POA: Diagnosis not present

## 2022-11-12 DIAGNOSIS — E7800 Pure hypercholesterolemia, unspecified: Secondary | ICD-10-CM | POA: Diagnosis not present

## 2022-11-12 DIAGNOSIS — Z8546 Personal history of malignant neoplasm of prostate: Secondary | ICD-10-CM | POA: Diagnosis not present

## 2022-11-12 DIAGNOSIS — E1169 Type 2 diabetes mellitus with other specified complication: Secondary | ICD-10-CM | POA: Diagnosis not present

## 2022-12-10 DIAGNOSIS — R9431 Abnormal electrocardiogram [ECG] [EKG]: Secondary | ICD-10-CM | POA: Diagnosis not present

## 2022-12-23 ENCOUNTER — Ambulatory Visit: Payer: Self-pay | Admitting: Cardiology

## 2022-12-29 ENCOUNTER — Telehealth: Payer: Self-pay | Admitting: Cardiology

## 2023-01-01 ENCOUNTER — Ambulatory Visit: Payer: Medicare PPO | Admitting: Internal Medicine

## 2023-01-09 ENCOUNTER — Ambulatory Visit: Payer: Medicare PPO

## 2023-01-09 ENCOUNTER — Ambulatory Visit: Payer: Medicare PPO | Attending: Cardiology | Admitting: Cardiology

## 2023-01-09 ENCOUNTER — Encounter: Payer: Self-pay | Admitting: Cardiology

## 2023-01-09 VITALS — BP 132/62 | HR 40 | Ht 70.0 in | Wt 227.0 lb

## 2023-01-09 DIAGNOSIS — E7800 Pure hypercholesterolemia, unspecified: Secondary | ICD-10-CM

## 2023-01-09 DIAGNOSIS — I441 Atrioventricular block, second degree: Secondary | ICD-10-CM | POA: Diagnosis not present

## 2023-01-09 DIAGNOSIS — I10 Essential (primary) hypertension: Secondary | ICD-10-CM | POA: Diagnosis not present

## 2023-01-09 NOTE — Patient Instructions (Signed)
Medication Instructions:   Your physician recommends that you continue on your current medications as directed. Please refer to the Current Medication list given to you today.  *If you need a refill on your cardiac medications before your next appointment, please call your pharmacy*   Lab Work:  .None Ordered  If you have labs (blood work) drawn today and your tests are completely normal, you will receive your results only by: MyChart Message (if you have MyChart) OR A paper copy in the mail If you have any lab test that is abnormal or we need to change your treatment, we will call you to review the results.   Testing/Procedures:  Your physician has requested that you have an echocardiogram. Echocardiography is a painless test that uses sound waves to create images of your heart. It provides your doctor with information about the size and shape of your heart and how well your heart's chambers and valves are working. This procedure takes approximately one hour. There are no restrictions for this procedure. Please do NOT wear cologne, perfume, aftershave, or lotions (deodorant is allowed). Please arrive 15 minutes prior to your appointment time.   Your physician has recommended that you wear a Zio monitor for 7 days.   This monitor is a medical device that records the heart's electrical activity. Doctors most often use these monitors to diagnose arrhythmias. Arrhythmias are problems with the speed or rhythm of the heartbeat. The monitor is a small device applied to your chest. You can wear one while you do your normal daily activities. While wearing this monitor if you have any symptoms to push the button and record what you felt. Once you have worn this monitor for the period of time provider prescribed (Usually 14 days), you will return the monitor device in the postage paid box. Once it is returned they will download the data collected and provide [place] with a report which the provider will  then review and we will call you with those results. Important tips:  Avoid showering during the first 24 hours of wearing the monitor. Avoid excessive sweating to help maximize wear time. Do not submerge the device, no hot tubs, and no swimming pools. Keep any lotions or oils away from the patch. After 24 hours you may shower with the patch on. Take brief showers with your back facing the shower head.  Do not remove patch once it has been placed because that will interrupt data and decrease adhesive wear time. Push the button when you have any symptoms and write down what you were feeling. Once you have completed wearing your monitor, remove and place into box which has postage paid and place in your outgoing mailbox.  If for some reason you have misplaced your box then call our office and we can provide another box and/or mail it off for you.     Follow-Up: At [facility], you and your health needs are our priority.  As part of our continuing mission to provide you with exceptional heart care, we have created designated Provider Care Teams.  These Care Teams include your primary Cardiologist (physician) and Advanced Practice Providers (APPs -  Physician Assistants and Nurse Practitioners) who all work together to provide you with the care you need, when you need it.  We recommend signing up for the patient portal called "MyChart".  Sign up information is provided on this After Visit Summary.  MyChart is used to connect with patients for Virtual Visits (Telemedicine).  Patients  are able to view lab/test results, encounter notes, upcoming appointments, etc.  Non-urgent messages can be sent to your provider as well.   To learn more about what you can do with MyChart, go to [email or web address].    Your next appointment:    After Cardiac testing  Provider:   You may see Debbe Odea, MD or one of the following Advanced Practice Providers on your designated Care Team:    Nicolasa Ducking, NP Eula Listen, PA-C Cadence Fransico Michael, PA-C Charlsie Quest, NP

## 2023-01-09 NOTE — Progress Notes (Signed)
Cardiology Office Note:    Date:  01/09/2023   ID:  WIL SLAPE, DOB 04/13/1946, MRN [id number]  PCP:  Irven Coe, MD   [facility] Providers Cardiologist:  Debbe Odea, MD     Referring MD: Clovis Riley, Elbert Ewings.August Saucer, MD   Chief Complaint  Patient presents with   New Patient (Initial Visit)    Referred for cardiac evaluation of abnormal EKG with cardiac seen by [facility] 12 years ago.    History of Present Illness:    ABDOULIE TIERCE is a 77 y.o. male with a hx of hypertension, hyperlipidemia, prostate cancer s/p radioactive seeding with control of tumor presenting with abnormal EKG.  Patient has a history of low heart rates, prior EKG in 2012 showing sinus bradycardia heart rate 56.  He was involved in a cholesterol research study, provider at the study noted his heart rate to be low and irregular, pulse in the 40s.  Followed up with PCP, EKG was obtained and noted to have possible heart block.  He denies chest pain, shortness of breath, dizziness, fatigue, presyncope or syncope.  States his heart rate has been low for years now.  Evaluated by cardiology over 12 years ago, did have a stress test, was told everything was okay and did not need any intervention.  Cholesterol is well-controlled on last check earlier this summer by PCP  Past Medical History:  Diagnosis Date   Cancer [facility])    prostate   Diabetes mellitus without complication (HCC)    Hyperlipidemia    Hypertension    Prostate cancer (HCC) 11/04/2010   Gleason 7, volume 45.48 cc    Past Surgical History:  Procedure Laterality Date   APPENDECTOMY  '75   CHOLECYSTECTOMY  2008   lap   RADIOACTIVE SEED IMPLANT  03/10/2011   Procedure: RADIOACTIVE SEED IMPLANT;  Surgeon: Garnett Farm, MD;  Location: [facility];  Service: Urology;  Laterality: N/A;  rad tech of [facility] at [facility] or    Current Medications: Current Meds  Medication Sig   ASPIRIN 81 PO Take 81 mg by mouth  daily.   atorvastatin (LIPITOR) 80 MG tablet Take 80 mg by mouth daily.   Cholecalciferol (VITAMIN D3) 50 MCG (2000 UT) CAPS Take 2,000 Units by mouth daily.   lisinopril-hydrochlorothiazide (ZESTORETIC) 10-12.5 MG tablet Take 1 tablet by mouth daily.   Multiple Vitamins-Minerals (HM COMPLETE 50+ MENS ULTIMATE PO)      Allergies:   Patient has no known allergies.   Social History   Socioeconomic History   Marital status: Married    Spouse name: Not on file   Number of children: Not on file   Years of education: Not on file   Highest education level: Not on file  Occupational History   Not on file  Tobacco Use   Smoking status: Never   Smokeless tobacco: Never  Vaping Use   Vaping status: Never Used  Substance and Sexual Activity   Alcohol use: Yes    Alcohol/week: 1.0 - 2.0 standard drink of alcohol    Types: 1 - 2 drink(s) per week    Comment: rare   Drug use: No   Sexual activity: Not on file  Other Topics Concern   Not on file  Social History Narrative   Not on file   Social Determinants of Health   Financial Resource Strain: Not on file  Food Insecurity: Not on file  Transportation Needs: Not on file  Physical Activity:  Not on file  Stress: Not on file  Social Connections: Not on file     Family History: The patient's family history includes Cancer in his cousin, father, maternal grandfather, and maternal uncle; Cancer (age of onset: 66) in his brother; Heart disease in his father.  ROS:   Please see the history of present illness.     All other systems reviewed and are negative.  EKGs/Labs/Other Studies Reviewed:    The following studies were reviewed today:  EKG Interpretation Date/Time:  Friday January 09 2023 10:50:39 EDT Ventricular Rate:  40 PR Interval:    QRS Duration:  92 QT Interval:  462 QTC Calculation: 376 R Axis:   71  Text Interpretation: Sinus bradycardia with 2nd degree A-V block (Mobitz I) ST & Marked T wave abnormality,  consider anterolateral ischemia Confirmed by Debbe Odea ([id number]) on 01/09/2023 11:04:50 AM    Recent Labs: No results found for requested labs within last 365 days.  Recent Lipid Panel No results found for: "CHOL", "TRIG", "HDL", "CHOLHDL", "VLDL", "LDLCALC", "LDLDIRECT"   Risk Assessment/Calculations:             Physical Exam:    VS:  BP 132/62 (BP Location: Right Arm, Patient Position: Sitting, Cuff Size: Normal)   Pulse (!) 40   Ht 5\' 10"  (1.778 m)   Wt 227 lb (103 kg)   SpO2 98%   BMI 32.57 kg/m     Wt Readings from Last 3 Encounters:  01/09/23 227 lb (103 kg)  03/28/11 255 lb 12.8 oz (116 kg)  03/03/11 250 lb (113.4 kg)     GEN:  Well nourished, well developed in no acute distress HEENT: Normal NECK: No JVD; No carotid bruits CARDIAC: Bradycardic, irregular, no murmurs RESPIRATORY:  Clear to auscultation without rales, wheezing or rhonchi  ABDOMEN: Soft, non-tender, non-distended MUSCULOSKELETAL:  No edema; No deformity  SKIN: Warm and dry NEUROLOGIC:  Alert and oriented x 3 PSYCHIATRIC:  Normal affect   ASSESSMENT:    1. Mobitz type 1 second degree AV block   2. Primary hypertension   3. Pure hypercholesterolemia    PLAN:    In order of problems listed above:  Mobitz 1 AV block noted, heart rate 40.  Not on any AV nodal agents.  Continue to avoid.  Place cardiac monitor to rule out any higher degree AV block.  Obtain echocardiogram.  Patient is otherwise asymptomatic.  Refer to EP.  I do not believe patient needs a pacemaker at this point unless higher degree AV block noted on cardiac monitor.  He may require in the future if conduction defect progresses. Hypertension, BP controlled.  Continue lisinopril, HCTZ. Hyperlipidemia, continue Lipitor 80.  Follow-up after echo and cardiac monitor.      Medication Adjustments/Labs and Tests Ordered: Current medicines are reviewed at length with the patient today.  Concerns regarding medicines are  outlined above.  Orders Placed This Encounter  Procedures   Ambulatory referral to Cardiac Electrophysiology   LONG TERM MONITOR (3-14 DAYS)   EKG 12-Lead   ECHOCARDIOGRAM COMPLETE   No orders of the defined types were placed in this encounter.   Patient Instructions  Medication Instructions:   Your physician recommends that you continue on your current medications as directed. Please refer to the Current Medication list given to you today.  *If you need a refill on your cardiac medications before your next appointment, please call your pharmacy*   Lab Work:  .None Ordered  If you have labs (  blood work) drawn today and your tests are completely normal, you will receive your results only by: MyChart Message (if you have MyChart) OR A paper copy in the mail If you have any lab test that is abnormal or we need to change your treatment, we will call you to review the results.   Testing/Procedures:  Your physician has requested that you have an echocardiogram. Echocardiography is a painless test that uses sound waves to create images of your heart. It provides your doctor with information about the size and shape of your heart and how well your heart's chambers and valves are working. This procedure takes approximately one hour. There are no restrictions for this procedure. Please do NOT wear cologne, perfume, aftershave, or lotions (deodorant is allowed). Please arrive 15 minutes prior to your appointment time.   Your physician has recommended that you wear a Zio monitor for 7 days.   This monitor is a medical device that records the heart's electrical activity. Doctors most often use these monitors to diagnose arrhythmias. Arrhythmias are problems with the speed or rhythm of the heartbeat. The monitor is a small device applied to your chest. You can wear one while you do your normal daily activities. While wearing this monitor if you have any symptoms to push the button and record  what you felt. Once you have worn this monitor for the period of time provider prescribed (Usually 14 days), you will return the monitor device in the postage paid box. Once it is returned they will download the data collected and provide [place] with a report which the provider will then review and we will call you with those results. Important tips:  Avoid showering during the first 24 hours of wearing the monitor. Avoid excessive sweating to help maximize wear time. Do not submerge the device, no hot tubs, and no swimming pools. Keep any lotions or oils away from the patch. After 24 hours you may shower with the patch on. Take brief showers with your back facing the shower head.  Do not remove patch once it has been placed because that will interrupt data and decrease adhesive wear time. Push the button when you have any symptoms and write down what you were feeling. Once you have completed wearing your monitor, remove and place into box which has postage paid and place in your outgoing mailbox.  If for some reason you have misplaced your box then call our office and we can provide another box and/or mail it off for you.     Follow-Up: At [facility], you and your health needs are our priority.  As part of our continuing mission to provide you with exceptional heart care, we have created designated Provider Care Teams.  These Care Teams include your primary Cardiologist (physician) and Advanced Practice Providers (APPs -  Physician Assistants and Nurse Practitioners) who all work together to provide you with the care you need, when you need it.  We recommend signing up for the patient portal called "MyChart".  Sign up information is provided on this After Visit Summary.  MyChart is used to connect with patients for Virtual Visits (Telemedicine).  Patients are able to view lab/test results, encounter notes, upcoming appointments, etc.  Non-urgent messages can be sent to your provider as  well.   To learn more about what you can do with MyChart, go to [email or web address].    Your next appointment:    After Cardiac testing  Provider:   You may see  Debbe Odea, MD or one of the following Advanced Practice Providers on your designated Care Team:   Nicolasa Ducking, NP Eula Listen, PA-C Cadence Fransico Michael, PA-C Charlsie Quest, NP   Signed, Debbe Odea, MD  01/09/2023 12:23 PM    [facility]

## 2023-01-13 DIAGNOSIS — I441 Atrioventricular block, second degree: Secondary | ICD-10-CM

## 2023-01-28 ENCOUNTER — Ambulatory Visit: Payer: No Typology Code available for payment source | Admitting: Cardiology

## 2023-01-29 ENCOUNTER — Ambulatory Visit: Payer: Medicare PPO | Attending: Cardiology

## 2023-01-29 DIAGNOSIS — I441 Atrioventricular block, second degree: Secondary | ICD-10-CM | POA: Diagnosis not present

## 2023-01-29 LAB — ECHOCARDIOGRAM COMPLETE
Area-P 1/2: 2.87 cm2
S' Lateral: 2.9 cm

## 2023-01-29 MED ORDER — PERFLUTREN LIPID MICROSPHERE
1.0000 mL | INTRAVENOUS | Status: AC | PRN
Start: 2023-01-29 — End: 2023-01-29
  Administered 2023-01-29: 3 mL via INTRAVENOUS

## 2023-01-30 ENCOUNTER — Telehealth: Payer: Self-pay | Admitting: Cardiology

## 2023-01-30 DIAGNOSIS — I441 Atrioventricular block, second degree: Secondary | ICD-10-CM | POA: Diagnosis not present

## 2023-01-30 NOTE — Telephone Encounter (Signed)
   Cardiac Monitor Alert  Date of alert:  01/30/2023   Patient Name: Corey Avila  DOB: 1945-05-21  [name]   [facility] Cardiologist: Debbe Odea, MD  [facility] EP:  None    Monitor Information: Long Term Monitor [ZioXT]  Reason:  Mobitz type 1 second degree AV block Ordering provider:  Dr. Azucena Cecil   Alert [phone] episodes of Complete Heart Block This is the 1st alert for this rhythm.   Next Cardiology Appointment   Date:  02/03/23  Provider:  Terrilee Croak, PA-C EP: Dr. Lalla Brothers 03/08/23  The patient was contacted today.  He is asymptomatic. Arrhythmia, symptoms and history reviewed with Dr. Azucena Cecil and [name].   Plan: The recommendations were to report to the ER for further evaluation and possibly for a pacemaker   The patient declined the ED recommendations and stated that he will go on Monday due to prior arrangements. The nurse reiterated the importance of seeking urgent evaluation; however, the patient continued to decline.  MD made aware   Parke Poisson, RN  01/30/2023 4:22 PM

## 2023-01-30 NOTE — Telephone Encounter (Signed)
Zio by Meredeth Ide is calling to report abnormal readings. Transferred to triage phone.

## 2023-02-03 ENCOUNTER — Ambulatory Visit: Payer: Medicare PPO | Attending: Medical | Admitting: Medical

## 2023-02-03 ENCOUNTER — Encounter: Payer: Self-pay | Admitting: Medical

## 2023-02-03 VITALS — BP 148/84 | HR 43 | Ht 70.0 in | Wt 228.0 lb

## 2023-02-03 DIAGNOSIS — I442 Atrioventricular block, complete: Secondary | ICD-10-CM | POA: Diagnosis not present

## 2023-02-03 DIAGNOSIS — Z79899 Other long term (current) drug therapy: Secondary | ICD-10-CM | POA: Diagnosis not present

## 2023-02-03 DIAGNOSIS — I10 Essential (primary) hypertension: Secondary | ICD-10-CM

## 2023-02-03 DIAGNOSIS — I441 Atrioventricular block, second degree: Secondary | ICD-10-CM

## 2023-02-03 NOTE — Progress Notes (Signed)
Cardiology Office Note:    Date:  02/03/2023   ID:  Corey Avila, DOB 08-Jun-1945, MRN [id number]  PCP:  Irven Coe, MD  [facility] Cardiologist:  Debbe Odea, MD  [facility] Electrophysiologist:  None   Referring MD: Irven Coe, MD   Chief Complaint: 1 month follow-up  History of Present Illness:    Corey Avila is a 77 y.o. male with a hx of hypertension, hyperlipidemia, prostate cancer status post radioactive seeding with control of tumor who is presenting for 1 month follow-up.  Patient was referred in September 2024 for abnormal EKG.  Patient reports history of low heart rate.  EKG at the office showed sinus bradycardia, heart rate of 40 with Mobitz type I noted.  Patient is not on any AV nodal blocking agents.  Heart monitor and echo were ordered.  Patient was referred to EP.  Echo showed a normal pump function of 60 to 65%, mild MR. Heart monitor showed average heart rate of 44, minimum heart rate of 25.  Predominantly underlying rhythm was normal sinus rhythm.  First-degree AV block present, greater than 4000 episodes of third-degree AV block lasting a total of 1 day 19 hours, second-degree AV block type I.  Rare ectopy.  Complete heart block noted in MD was notified.  MD recommended ER evaluation, patient however declined this.  I myself called patient on 01/30/2023 and explained heart monitor and recommendations to go to the ER.  Patient said he had obligations over the weekend and again declined going to the ER. However, he said he would go on Monday 10/21.  Today, patient says he was unable to go to the ER on Monday due to an upcoming meeting with his church on Thursday. EKG today shows SB 43bpm  with second degree AV block Type 1. I again explained the emergent situation and went over the heart monitor results and he voiced his understanding.  We also discussed echo results.  Again, I recommended ER evaluation, but patient declined this.  He offered to take him  to the ER immediately, however he said he would go next Monday.  Patient has an appointment with Dr. Lalla Brothers on November 27.  I recommended patient not drive.    The patient denied any chest pain, shortness of breath, dizziness, lightheadedness, presyncope, syncope, lower leg edema, heart racing.  Past Medical History:  Diagnosis Date   Cancer [facility])    prostate   Complete heart block (HCC)    Diabetes mellitus without complication (HCC)    Hyperlipidemia    Hypertension    Prostate cancer (HCC) 11/04/2010   Gleason 7, volume 45.48 cc    Past Surgical History:  Procedure Laterality Date   APPENDECTOMY  '75   CHOLECYSTECTOMY  2008   lap   RADIOACTIVE SEED IMPLANT  03/10/2011   Procedure: RADIOACTIVE SEED IMPLANT;  Surgeon: Garnett Farm, MD;  Location: [facility];  Service: Urology;  Laterality: N/A;  rad tech of [facility]    Current Medications: Current Meds  Medication Sig   ASPIRIN 81 PO Take 81 mg by mouth daily.   atorvastatin (LIPITOR) 80 MG tablet Take 80 mg by mouth daily.   Cholecalciferol (VITAMIN D3) 50 MCG (2000 UT) CAPS Take 2,000 Units by mouth daily.   lisinopril-hydrochlorothiazide (ZESTORETIC) 10-12.5 MG tablet Take 1 tablet by mouth daily.   Multiple Vitamins-Minerals (HM COMPLETE 50+ MENS ULTIMATE PO)      Allergies:   Patient has  no known allergies.   Social History   Socioeconomic History   Marital status: Married    Spouse name: Not on file   Number of children: Not on file   Years of education: Not on file   Highest education level: Not on file  Occupational History   Not on file  Tobacco Use   Smoking status: Never   Smokeless tobacco: Never  Vaping Use   Vaping status: Never Used  Substance and Sexual Activity   Alcohol use: Yes    Alcohol/week: 1.0 - 2.0 standard drink of alcohol    Types: 1 - 2 drink(s) per week    Comment: rare   Drug use: No   Sexual activity: Not on file  Other Topics Concern   Not on  file  Social History Narrative   Not on file   Social Determinants of Health   Financial Resource Strain: Not on file  Food Insecurity: Not on file  Transportation Needs: Not on file  Physical Activity: Not on file  Stress: Not on file  Social Connections: Not on file     Family History: The patient's family history includes Cancer in his cousin, father, maternal grandfather, and maternal uncle; Cancer (age of onset: 96) in his brother; Heart disease in his father.  ROS:   Please see the history of present illness.     All other systems reviewed and are negative.  EKGs/Labs/Other Studies Reviewed:    The following studies were reviewed today:  Echo 01/2023  1. Left ventricular ejection fraction, by estimation, is 60 to 65%. The  left ventricle has normal function. The left ventricle has no regional  wall motion abnormalities. Left ventricular diastolic parameters were  normal. The average left ventricular  global longitudinal strain is -21.5 %.   2. Right ventricular systolic function is normal. The right ventricular  size is normal. Tricuspid regurgitation signal is inadequate for assessing  PA pressure.   3. The mitral valve is normal in structure. Mild mitral valve  regurgitation. No evidence of mitral stenosis.   4. The aortic valve is normal in structure. Aortic valve regurgitation is  not visualized. Aortic valve sclerosis is present, with no evidence of  aortic valve stenosis.   5. The inferior vena cava is normal in size with greater than 50%  respiratory variability, suggesting right atrial pressure of 3 mmHg.   EKG:  EKG is ordered today.  The ekg ordered today demonstrates second degree type 1  Recent Labs: No results found for requested labs within last 365 days.  Recent Lipid Panel No results found for: "CHOL", "TRIG", "HDL", "CHOLHDL", "VLDL", "LDLCALC", "LDLDIRECT"   Physical Exam:    VS:  BP (!) 148/84 (BP Location: Left Arm, Patient Position:  Sitting, Cuff Size: Normal)   Pulse (!) 43   Ht 5\' 10"  (1.778 m)   Wt 228 lb (103.4 kg)   SpO2 97%   BMI 32.71 kg/m     Wt Readings from Last 3 Encounters:  02/03/23 228 lb (103.4 kg)  01/09/23 227 lb (103 kg)  03/28/11 255 lb 12.8 oz (116 kg)     GEN:  Well nourished, well developed in no acute distress HEENT: Normal NECK: No JVD; No carotid bruits LYMPHATICS: No lymphadenopathy CARDIAC: bradycardia, RR, no murmurs, rubs, gallops RESPIRATORY:  Clear to auscultation without rales, wheezing or rhonchi  ABDOMEN: Soft, non-tender, non-distended MUSCULOSKELETAL:  No edema; No deformity  SKIN: Warm and dry NEUROLOGIC:  Alert and oriented x 3  PSYCHIATRIC:  Normal affect   ASSESSMENT:    1. Heart block AV complete (HCC)   2. Mobitz type 1 second degree AV block   3. Primary hypertension   4. Medication management    PLAN:    In order of problems listed above:  High-grade heart block Recent heart monitor showed high-grade heart block and the patient was contacted via telephone on 01/30/2023 with recommendations to go to the ER, however he declined.  I also called patient on 01/30/2023 to explain the heart monitor results with the same recommendation of ER evaluation, however the patient said he had obligations over the weekend and was unable to go.  He said he would go on Monday (10/21), but did not go to the ER.  He presents today as an office visit. HE is overall asymptomatic with no pre-syncope, syncope, lightheadedness, dizziness, chest pain sob, or LLE. Heart monitor and echo results were discussed in detail. EKG today showed second degree AV block type 1. Heart block as well as treatment of a pacemaker was explained and the patient voiced his understanding. The emergent/urgent situation was explained and we offered to take him to the ER immediately, however he declined. He has an appoint with EP on March 11, 2023.   I recommend patient not drive.  Patient said he would go to ER  next Monday, however unsure this will occur.  I will get labs today, c-Met, CBC, TSH, mag, lipid panel.  We will try to get him in with EP ASAP.  HTN BP 148/84, continue Lisinopril-hydrochlorothiazide.   Disposition: Follow up in 1 month(s) with MD/APP    Signed, Monetta Lick David Stall, PA-C  02/03/2023 1:24 PM    [facility]

## 2023-02-03 NOTE — Patient Instructions (Signed)
Medication Instructions:  Your physician recommends that you continue on your current medications as directed. Please refer to the Current Medication list given to you today.   *If you need a refill on your cardiac medications before your next appointment, please call your pharmacy*   Lab Work: Your provider would like for you to have following labs drawn today (CMP, CBC, Mg,TSH, Lipid. Direct LDL).  .   Testing/Procedures: No labs ordered today    Follow-Up: Your physician recommends that you schedule a follow-up appointment with EP as soon as possible

## 2023-02-04 ENCOUNTER — Encounter: Payer: Self-pay | Admitting: Internal Medicine

## 2023-02-04 ENCOUNTER — Ambulatory Visit: Payer: Medicare PPO | Attending: Cardiology | Admitting: Internal Medicine

## 2023-02-04 VITALS — BP 136/68 | HR 73 | Ht 70.0 in | Wt 227.0 lb

## 2023-02-04 DIAGNOSIS — Z8546 Personal history of malignant neoplasm of prostate: Secondary | ICD-10-CM | POA: Insufficient documentation

## 2023-02-04 DIAGNOSIS — I422 Other hypertrophic cardiomyopathy: Secondary | ICD-10-CM

## 2023-02-04 DIAGNOSIS — I442 Atrioventricular block, complete: Secondary | ICD-10-CM

## 2023-02-04 DIAGNOSIS — E1169 Type 2 diabetes mellitus with other specified complication: Secondary | ICD-10-CM | POA: Insufficient documentation

## 2023-02-04 LAB — COMPREHENSIVE METABOLIC PANEL
ALT: 34 [IU]/L (ref 0–44)
AST: 28 [IU]/L (ref 0–40)
Albumin: 4.2 g/dL (ref 3.8–4.8)
Alkaline Phosphatase: 71 [IU]/L (ref 44–121)
BUN/Creatinine Ratio: 12 (ref 10–24)
BUN: 19 mg/dL (ref 8–27)
Bilirubin Total: 0.7 mg/dL (ref 0.0–1.2)
CO2: 25 mmol/L (ref 20–29)
Calcium: 9.8 mg/dL (ref 8.6–10.2)
Chloride: 101 mmol/L (ref 96–106)
Creatinine, Ser: 1.63 mg/dL — ABNORMAL HIGH (ref 0.76–1.27)
Globulin, Total: 2.8 g/dL (ref 1.5–4.5)
Glucose: 118 mg/dL — ABNORMAL HIGH (ref 70–99)
Potassium: 4 mmol/L (ref 3.5–5.2)
Sodium: 141 mmol/L (ref 134–144)
Total Protein: 7 g/dL (ref 6.0–8.5)
eGFR: 43 mL/min/{1.73_m2} — ABNORMAL LOW (ref >59)

## 2023-02-04 LAB — LIPID PANEL
Chol/HDL Ratio: 2.6 ratio (ref 0.0–5.0)
Cholesterol, Total: 124 mg/dL (ref 100–199)
HDL: 47 mg/dL (ref >39)
LDL Chol Calc (NIH): 63 mg/dL (ref 0–99)
Triglycerides: 69 mg/dL (ref 0–149)
VLDL Cholesterol Cal: 14 mg/dL (ref 5–40)

## 2023-02-04 LAB — CBC
Hematocrit: 45 % (ref 37.5–51.0)
Hemoglobin: 14.7 g/dL (ref 13.0–17.7)
MCH: 30.5 pg (ref 26.6–33.0)
MCHC: 32.7 g/dL (ref 31.5–35.7)
MCV: 93 fL (ref 79–97)
Platelets: 181 x10E3/uL (ref 150–450)
RBC: 4.82 x10E6/uL (ref 4.14–5.80)
RDW: 13.1 % (ref 11.6–15.4)
WBC: 7 x10E3/uL (ref 3.4–10.8)

## 2023-02-04 LAB — TSH: TSH: 0.841 u[IU]/mL (ref 0.450–4.500)

## 2023-02-04 LAB — MAGNESIUM: Magnesium: 1.9 mg/dL (ref 1.6–2.3)

## 2023-02-04 LAB — LDL CHOLESTEROL, DIRECT: LDL Direct: 61 mg/dL (ref 0–99)

## 2023-02-04 NOTE — Progress Notes (Signed)
ELECTROPHYSIOLOGY CONSULT NOTE  Patient ID: ARBOR [name], [name], DOB/AGE: 12-13-1945 77 y.o. Admit date: (Not on file) Date of Consult: 02/04/2023  Primary Physician: Irven Coe, MD Primary Cardiologist: [facility]     Corey Avila is a 77 y.o. male who is being seen today for the evaluation of Complete heart block  at the request of BAE/CF-PA.    HPI Corey Avila is a 77 y.o. male referred because of bradycardia.  He had presented initially for bradycardia having presented himself for participation in a research study at which time he was found to have a heart rate in the 40s.  This was corroborated by ECG demonstrating sinus rhythm/sinus bradycardia with Mobitz 1 heart block.  He says he has a history of a slow heart rate dating back 10 years although last ECG we have prior to very recently is 2012.  He was given a Zio patch which demonstrated heart rate excursions up to 100+ median daytime heart rates in the 60s and nocturnal bradycardia with nocturnal complete heart block; all his bradycardia and heart block is associate with a narrow QRS and most of it with a Mobitz 1 physiology  He denies exercise intolerance.  He has had no shortness of breath chest pain lightheadedness presyncope or peripheral edema  Longstanding hypertension  DATE TEST EF   10/24 Echo   60-65 %         Date Cr K Hgb TSH  10/24 0.63 4.0 14.7 0.841       Past Medical History:  Diagnosis Date   Cancer [facility])    prostate   Complete heart block (HCC)    Hyperlipidemia    Hypertension    Prostate cancer (HCC) 11/04/2010   Gleason 7, volume 45.48 cc      Surgical History:  Past Surgical History:  Procedure Laterality Date   APPENDECTOMY  '75   CHOLECYSTECTOMY  2008   lap   RADIOACTIVE SEED IMPLANT  03/10/2011   Procedure: RADIOACTIVE SEED IMPLANT;  Surgeon: Garnett Farm, MD;  Location: [facility];  Service: Urology;  Laterality: N/A;  rad tech of [facility]  [facility] or     Home Meds: Current Meds  Medication Sig   ASPIRIN 81 PO Take 81 mg by mouth daily.   atorvastatin (LIPITOR) 80 MG tablet Take 80 mg by mouth daily.   Cholecalciferol (VITAMIN D3) 50 MCG (2000 UT) CAPS Take 2,000 Units by mouth daily.   lisinopril-hydrochlorothiazide (ZESTORETIC) 10-12.5 MG tablet Take 1 tablet by mouth daily.   Multiple Vitamins-Minerals (HM COMPLETE 50+ MENS ULTIMATE PO)     Allergies: No Known Allergies  Social History   Socioeconomic History   Marital status: Married    Spouse name: Not on file   Number of children: Not on file   Years of education: Not on file   Highest education level: Not on file  Occupational History   Not on file  Tobacco Use   Smoking status: Never   Smokeless tobacco: Never  Vaping Use   Vaping status: Never Used  Substance and Sexual Activity   Alcohol use: Yes    Alcohol/week: 1.0 - 2.0 standard drink of alcohol    Types: 1 - 2 drink(s) per week    Comment: rare   Drug use: No   Sexual activity: Not on file  Other Topics Concern   Not on file  Social History Narrative   Not on file  Social Determinants of Health   Financial Resource Strain: Not on file  Food Insecurity: Not on file  Transportation Needs: Not on file  Physical Activity: Not on file  Stress: Not on file  Social Connections: Not on file  Intimate Partner Violence: Not on file     Family History  Problem Relation Age of Onset   Heart disease Father        pacemaker   Cancer Father        prostate   Cancer Brother 64       prostate   Cancer Maternal Uncle        prostate   Cancer Maternal Grandfather        prostate   Cancer Cousin        maternal, prostate     ROS:  Please see the history of present illness.     All other systems reviewed and negative.    Physical Exam: Blood pressure 136/68, pulse 73, height 5\' 10"  (1.778 m), weight 227 lb (103 kg), SpO2 97%. General: Well developed, well nourished male in no acute  distress. Head: Normocephalic, atraumatic, sclera non-icteric, no xanthomas, nares are without discharge. EENT: normal  Lymph Nodes:  none Neck: Negative for carotid bruits. JVD not elevated. Back:without scoliosis kyphosis Lungs: Clear bilaterally to auscultation without wheezes, rales, or rhonchi. Breathing is unlabored. Heart: Slow and irregular RR with S1 S2. No  systolic murmur . No rubs, or gallops appreciated. Abdomen: Soft, non-tender, non-distended with normoactive bowel sounds. No hepatomegaly. No rebound/guarding. No obvious abdominal masses. Msk:  Strength and tone appear normal for age. Extremities: No clubbing or cyanosis or  edema.  Distal pedal pulses are 2+ and equal bilaterally. Skin: Warm and Dry Neuro: Alert and oriented X 3. CN III-XII intact Grossly normal sensory and motor function . Psych:  Responds to questions appropriately with a normal affect.        EKG: sinus  @ 60 3:2 AV block T wave inversions V4-V6    Assessment and Plan:  Mobitz 1 second-degree AV block  Intermittent narrow QRS escape complete heart block primarily nocturnal  Hypertension  Abnormal ECG  Hyperlipidemia ( Rx by PCP    [name] has asymptomatic primarily 2AVB 1 and nocturnal (primarily) complete heart block all with a narrow QRS escape. In the absence of symptoms, I would observe.  I have stressed with him the importance of attending to any changes in functional status or episodes of lightheadedness.  His ECG is impressively abnormal.  No left ventricular hypertrophy was reported on echo.  The pattern suggests apical hypertrophy which might be hard to see in the near field of an echocardiogram.  It does not relate importantly to him but he has many siblings and second-degree relatives and so the diagnosis of hypertrophic cardiomyopathy has some potential familial care implications.  We will obtain a cMRI.  We will be glad to see him as needed   Sherryl Manges

## 2023-02-04 NOTE — Patient Instructions (Signed)
Medication Instructions:  The current medical regimen is effective;  continue present plan and medications.  *If you need a refill on your cardiac medications before your next appointment, please call your pharmacy*   Testing/Procedures:   You are scheduled for Cardiac MRI at the location below.   [facility] [address] [place], [place] [address] [phone] Please take advantage of the free valet parking available at the [facility] and [employer] (Entrance C).  Proceed to the [facility] Radiology Department (First Floor) for check-in.   OR   [facility] [address] [place], [place] [address] [phone] Please go to the [facility] and check-in with the desk attendant.   Magnetic resonance imaging (MRI) is a painless test that produces images of the inside of the body without using Xrays.  During an MRI, strong magnets and radio waves work together in a Data processing manager to form detailed images.   MRI images may provide more details about a medical condition than X-rays, CT scans, and ultrasounds can provide.  You may be given earphones to listen for instructions.  You may eat a light breakfast and take medications as ordered with the exception of furosemide, hydrochlorothiazide, or spironolactone(fluid pill, other). If you are undergoing a stress MRI, please avoid stimulants for 12 hr prior to test. (Ie. Caffeine, nicotine, chocolate, or antihistamine medications)  An IV will be inserted into one of your veins. Contrast material will be injected into your IV. It will leave your body through your urine within a day. You may be told to drink plenty of fluids to help flush the contrast material out of your system.  You will be asked to remove all metal, including: Watch, jewelry, and other metal objects including hearing aids, hair pieces and dentures. Also wearable glucose monitoring systems (ie. Freestyle Libre and  Omnipods) (Braces and fillings normally are not a problem.)   TEST WILL TAKE APPROXIMATELY 1 HOUR  PLEASE NOTIFY SCHEDULING AT LEAST 24 HOURS IN ADVANCE IF YOU ARE UNABLE TO KEEP YOUR APPOINTMENT. [phone]  For more information and frequently asked questions, please visit our website : [email or web address]  Please call the Cardiac Imaging Nurse Navigators with any questions/concerns. [phone] Office     Follow-Up: At [facility], you and your health needs are our priority.  As part of our continuing mission to provide you with exceptional heart care, we have created designated Provider Care Teams.  These Care Teams include your primary Cardiologist (physician) and Advanced Practice Providers (APPs -  Physician Assistants and Nurse Practitioners) who all work together to provide you with the care you need, when you need it.  We recommend signing up for the patient portal called "MyChart".  Sign up information is provided on this After Visit Summary.  MyChart is used to connect with patients for Virtual Visits (Telemedicine).  Patients are able to view lab/test results, encounter notes, upcoming appointments, etc.  Non-urgent messages can be sent to your provider as well.   To learn more about what you can do with MyChart, go to [email or web address].    Your next appointment:   As needed  Provider:   Sherryl Manges, MD

## 2023-02-05 ENCOUNTER — Other Ambulatory Visit: Payer: Self-pay

## 2023-02-05 DIAGNOSIS — Z79899 Other long term (current) drug therapy: Secondary | ICD-10-CM

## 2023-02-05 MED ORDER — LISINOPRIL 10 MG PO TABS: 10.0000 mg | ORAL_TABLET | Freq: Every day | ORAL | 3 refills | Status: DC

## 2023-02-17 ENCOUNTER — Institutional Professional Consult (permissible substitution): Payer: Medicare PPO | Admitting: Internal Medicine

## 2023-02-20 ENCOUNTER — Ambulatory Visit: Payer: No Typology Code available for payment source | Admitting: Cardiology

## 2023-03-11 ENCOUNTER — Institutional Professional Consult (permissible substitution): Payer: Medicare PPO | Admitting: Cardiology

## 2023-03-24 DIAGNOSIS — H43813 Vitreous degeneration, bilateral: Secondary | ICD-10-CM | POA: Diagnosis not present

## 2023-03-24 DIAGNOSIS — H524 Presbyopia: Secondary | ICD-10-CM | POA: Diagnosis not present

## 2023-03-24 DIAGNOSIS — H25813 Combined forms of age-related cataract, bilateral: Secondary | ICD-10-CM | POA: Diagnosis not present

## 2023-06-08 ENCOUNTER — Encounter (HOSPITAL_COMMUNITY): Payer: Self-pay

## 2023-06-10 ENCOUNTER — Ambulatory Visit
Admission: RE | Admit: 2023-06-10 | Discharge: 2023-06-10 | Disposition: A | Discharge status: Home / Self Care | Payer: Medicare PPO | Source: Ambulatory Visit | Attending: Internal Medicine | Admitting: Internal Medicine

## 2023-06-10 ENCOUNTER — Other Ambulatory Visit: Payer: Self-pay | Admitting: Internal Medicine

## 2023-06-10 DIAGNOSIS — I422 Other hypertrophic cardiomyopathy: Secondary | ICD-10-CM | POA: Diagnosis present

## 2023-06-10 MED ORDER — GADOBUTROL 1 MMOL/ML IV SOLN
13.0000 mL | Freq: Once | INTRAVENOUS | Status: AC | PRN
  Administered 2023-06-10: 13 mL via INTRAVENOUS

## 2023-06-17 ENCOUNTER — Other Ambulatory Visit: Payer: Self-pay | Admitting: Nephrology

## 2023-06-17 DIAGNOSIS — E1129 Type 2 diabetes mellitus with other diabetic kidney complication: Secondary | ICD-10-CM

## 2023-06-17 DIAGNOSIS — R8290 Unspecified abnormal findings in urine: Secondary | ICD-10-CM

## 2023-06-17 DIAGNOSIS — E785 Hyperlipidemia, unspecified: Secondary | ICD-10-CM

## 2023-06-17 DIAGNOSIS — N1831 Chronic kidney disease, stage 3a: Secondary | ICD-10-CM

## 2023-06-17 DIAGNOSIS — N2581 Secondary hyperparathyroidism of renal origin: Secondary | ICD-10-CM

## 2023-06-17 DIAGNOSIS — I10 Essential (primary) hypertension: Secondary | ICD-10-CM

## 2023-06-17 DIAGNOSIS — R809 Proteinuria, unspecified: Secondary | ICD-10-CM

## 2023-06-26 ENCOUNTER — Ambulatory Visit
Admission: RE | Admit: 2023-06-26 | Discharge: 2023-06-26 | Disposition: A | Discharge status: Home / Self Care | Source: Ambulatory Visit | Attending: Nephrology | Admitting: Nephrology

## 2023-06-26 DIAGNOSIS — R809 Proteinuria, unspecified: Secondary | ICD-10-CM | POA: Insufficient documentation

## 2023-06-26 DIAGNOSIS — R8290 Unspecified abnormal findings in urine: Secondary | ICD-10-CM | POA: Diagnosis present

## 2023-06-26 DIAGNOSIS — N2581 Secondary hyperparathyroidism of renal origin: Secondary | ICD-10-CM | POA: Diagnosis present

## 2023-06-26 DIAGNOSIS — E1129 Type 2 diabetes mellitus with other diabetic kidney complication: Secondary | ICD-10-CM | POA: Insufficient documentation

## 2023-06-26 DIAGNOSIS — I10 Essential (primary) hypertension: Secondary | ICD-10-CM | POA: Insufficient documentation

## 2023-06-26 DIAGNOSIS — N1831 Chronic kidney disease, stage 3a: Secondary | ICD-10-CM | POA: Diagnosis present

## 2023-06-26 DIAGNOSIS — E785 Hyperlipidemia, unspecified: Secondary | ICD-10-CM | POA: Insufficient documentation

## 2023-07-15 DIAGNOSIS — R809 Proteinuria, unspecified: Secondary | ICD-10-CM | POA: Diagnosis not present

## 2023-07-15 DIAGNOSIS — E1129 Type 2 diabetes mellitus with other diabetic kidney complication: Secondary | ICD-10-CM | POA: Diagnosis not present

## 2023-07-15 DIAGNOSIS — N1832 Chronic kidney disease, stage 3b: Secondary | ICD-10-CM | POA: Diagnosis not present

## 2023-07-15 DIAGNOSIS — N2581 Secondary hyperparathyroidism of renal origin: Secondary | ICD-10-CM | POA: Diagnosis not present

## 2023-07-15 DIAGNOSIS — E785 Hyperlipidemia, unspecified: Secondary | ICD-10-CM | POA: Diagnosis not present

## 2023-07-15 DIAGNOSIS — I10 Essential (primary) hypertension: Secondary | ICD-10-CM | POA: Diagnosis not present

## 2023-08-10 DIAGNOSIS — I10 Essential (primary) hypertension: Secondary | ICD-10-CM | POA: Diagnosis not present

## 2023-08-13 DIAGNOSIS — N2581 Secondary hyperparathyroidism of renal origin: Secondary | ICD-10-CM | POA: Diagnosis not present

## 2023-08-13 DIAGNOSIS — I10 Essential (primary) hypertension: Secondary | ICD-10-CM | POA: Diagnosis not present

## 2023-08-13 DIAGNOSIS — R809 Proteinuria, unspecified: Secondary | ICD-10-CM | POA: Diagnosis not present

## 2023-08-13 DIAGNOSIS — E1129 Type 2 diabetes mellitus with other diabetic kidney complication: Secondary | ICD-10-CM | POA: Diagnosis not present

## 2023-08-13 DIAGNOSIS — E785 Hyperlipidemia, unspecified: Secondary | ICD-10-CM | POA: Diagnosis not present

## 2023-08-13 DIAGNOSIS — N1832 Chronic kidney disease, stage 3b: Secondary | ICD-10-CM | POA: Diagnosis not present

## 2023-11-11 DIAGNOSIS — I10 Essential (primary) hypertension: Secondary | ICD-10-CM | POA: Diagnosis not present

## 2023-11-11 DIAGNOSIS — E7800 Pure hypercholesterolemia, unspecified: Secondary | ICD-10-CM | POA: Diagnosis not present

## 2023-11-11 DIAGNOSIS — N2581 Secondary hyperparathyroidism of renal origin: Secondary | ICD-10-CM | POA: Diagnosis not present

## 2023-11-11 DIAGNOSIS — E1169 Type 2 diabetes mellitus with other specified complication: Secondary | ICD-10-CM | POA: Diagnosis not present

## 2023-11-11 DIAGNOSIS — N1832 Chronic kidney disease, stage 3b: Secondary | ICD-10-CM | POA: Diagnosis not present

## 2023-11-13 DIAGNOSIS — I459 Conduction disorder, unspecified: Secondary | ICD-10-CM | POA: Diagnosis not present

## 2023-11-13 DIAGNOSIS — N1832 Chronic kidney disease, stage 3b: Secondary | ICD-10-CM | POA: Diagnosis not present

## 2023-11-13 DIAGNOSIS — I10 Essential (primary) hypertension: Secondary | ICD-10-CM | POA: Diagnosis not present

## 2023-11-13 DIAGNOSIS — E1169 Type 2 diabetes mellitus with other specified complication: Secondary | ICD-10-CM | POA: Diagnosis not present

## 2023-11-13 DIAGNOSIS — N2581 Secondary hyperparathyroidism of renal origin: Secondary | ICD-10-CM | POA: Diagnosis not present

## 2023-11-13 DIAGNOSIS — E7800 Pure hypercholesterolemia, unspecified: Secondary | ICD-10-CM | POA: Diagnosis not present

## 2023-11-16 DIAGNOSIS — I10 Essential (primary) hypertension: Secondary | ICD-10-CM | POA: Diagnosis not present

## 2023-11-16 DIAGNOSIS — N1832 Chronic kidney disease, stage 3b: Secondary | ICD-10-CM | POA: Diagnosis not present

## 2023-11-16 DIAGNOSIS — E785 Hyperlipidemia, unspecified: Secondary | ICD-10-CM | POA: Diagnosis not present

## 2023-11-16 DIAGNOSIS — N2581 Secondary hyperparathyroidism of renal origin: Secondary | ICD-10-CM | POA: Diagnosis not present

## 2023-11-16 DIAGNOSIS — R809 Proteinuria, unspecified: Secondary | ICD-10-CM | POA: Diagnosis not present

## 2023-11-16 DIAGNOSIS — E1129 Type 2 diabetes mellitus with other diabetic kidney complication: Secondary | ICD-10-CM | POA: Diagnosis not present

## 2024-03-25 ENCOUNTER — Other Ambulatory Visit: Payer: Self-pay | Admitting: Medical

## 2024-03-25 NOTE — Telephone Encounter (Signed)
Please contact pt for future appointment. 

## 2024-04-12 NOTE — Progress Notes (Signed)
 " Cardiology Office Note   Date:  05/03/2024  ID:  Corey Avila, Corey Avila [birth year], MRN [id number] PCP: Leonel Cole, MD  [facility] Providers Cardiologist:  Redell Cave, MD  History of Present Illness Corey Avila is a 78 y.o. male with a hx of hypertension, hyperlipidemia, high grade heart block, CKD stage 3, DM2, prostate cancer status post radioactive seeding with control of tumor who is presenting for follow-up of high grade heart block.   Patient was referred in September 2024 for abnormal EKG.  Patient reports history of low heart rate.  EKG at the office showed sinus bradycardia, heart rate of 40 with Mobitz type I noted.  Patient is not on any AV nodal blocking agents.  Heart monitor and echo were ordered.  Patient was referred to EP.  Echo showed a normal pump function of 60 to 65%, mild MR. Heart monitor showed average heart rate of 44, minimum heart rate of 25.  Predominantly underlying rhythm was normal sinus rhythm.  First-degree AV block present, greater than 4000 episodes of third-degree AV block lasting a total of 1 day 19 hours, second-degree AV block type I.  Rare ectopy.  Complete heart block noted in MD was notified.  MD recommended ER evaluation, patient however declined this.  I myself called patient on 01/30/2023 and explained heart monitor and recommendations to go to the ER.  Patient said he had obligations over the weekend and again declined going to the ER. However, he said he would go on Monday 10/21.  Tthe patient did not go to the ER and was seen in the office 02/03/2023.  EKG showed sinus bradycardia with second-degree AV block type I.  The emergent situation was explained to the patient, but patient was not interested in going to the ER.  He was made an appointment with EP for the next day.  Patient saw Dr. Fernande 02/04/2023 reporting asymptomatic primarily secondary AV block 1 and nocturnal complete heart block with narrow QRS escape.  Heart rate  appropriately increase with exertion.  Cardiac MRI was ordered to assess for hypertrophic cardiomyopathy.  No pacemaker was recommended at that time.  Cardiac MRI showed normal pump function, mild LVH with no HCM, no LGE or scar, normal RV size and function, no significant valvular abnormalities.  Today, the patient is in third degree AV block. He denies lightheadedness, dizziness, lower leg edema, fatigue, tiredness, syncope. He does no formal activity, but reports heart rate does elevate with activity. BP is a little high today. BP is normal better at home, 120/60s.  Studies Reviewed EKG Interpretation Date/Time:  Wednesday April 13 2024 08:15:14 EST Ventricular Rate:  39 PR Interval:    QRS Duration:  84 QT Interval:  458 QTC Calculation: 368 R Axis:   60  Text Interpretation: Third degree heart block ST & T wave abnormality, consider inferolateral ischemia When compared with ECG of 04-Feb-2023 13:19, Atrial fibrillation has replaced Sinus rhythm Confirmed by Franchester, [name] ([id number]) on 05/03/2024 11:24:02 AM    cMRI 05/2023 IMPRESSION: 1.  Normal LV size and systolic function.  LVEF 59%.   2.  Mild LVH with no evidence for HCM.   3.  No LGE or scar.   4.  Normal RV size and function.   5.  No significant valvular abnormalities.     Heart monitor 01/2023 Patch Wear Time:  6 days and 22 hours (2024-10-01T06:46:32-0400 to 2024-10-08T05:39:29-0400)   Patient had a min HR of 25 bpm, max HR of  103 bpm, and avg HR of 44 bpm. Predominant underlying rhythm was Sinus Rhythm. First Degree AV Block was present. 4764 episode(s) of AV Block (3rd) occurred, lasting a total of 1 day 19 hours. Second Degree AV  Block-Mobitz I (Wenckebach) was present. Isolated SVEs were rare (<1.0%), SVE Couplets were rare (<1.0%), and SVE Triplets were rare (<1.0%). Isolated VEs were rare (<1.0%), VE Couplets were rare (<1.0%), and no VE Triplets were present. Ventricular  Bigeminy was present. MD notification  criteria for Complete Heart Block met - report posted prior to notification per account request [id number] [facility]).     Evidence of high degree AV block noted.  Patient was contacted regarding going to the ED for eval and potential pacemaker.    Echo 01/2023  1. Left ventricular ejection fraction, by estimation, is 60 to 65%. The  left ventricle has normal function. The left ventricle has no regional  wall motion abnormalities. Left ventricular diastolic parameters were  normal. The average left ventricular  global longitudinal strain is -21.5 %.   2. Right ventricular systolic function is normal. The right ventricular  size is normal. Tricuspid regurgitation signal is inadequate for assessing  PA pressure.   3. The mitral valve is normal in structure. Mild mitral valve  regurgitation. No evidence of mitral stenosis.   4. The aortic valve is normal in structure. Aortic valve regurgitation is  not visualized. Aortic valve sclerosis is present, with no evidence of  aortic valve stenosis.   5. The inferior vena cava is normal in size with greater than 50%  respiratory variability, suggesting right atrial pressure of 3 mmHg.         Physical Exam VS:  BP (!) 150/60 (BP Location: Left Arm, Patient Position: Sitting, Cuff Size: Normal)   Pulse (!) 39   Ht 5' 10 (1.778 m)   Wt 223 lb (101.2 kg)   SpO2 99%   BMI 32.00 kg/m        Wt Readings from Last 3 Encounters:  04/13/24 223 lb (101.2 kg)  06/10/23 227 lb (103 kg)  02/04/23 227 lb (103 kg)    GEN: Well nourished, well developed in no acute distress NECK: No JVD; No carotid bruits CARDIAC: bradcyardia, no murmurs, rubs, gallops RESPIRATORY:  Clear to auscultation without rales, wheezing or rhonchi  ABDOMEN: Soft, non-tender, non-distended EXTREMITIES:  No edema; No deformity   ASSESSMENT AND PLAN  CHB Patient has a h/o mobitz type 1 second degree AV block and CHB.  Today, he is in complete heart block with a heart rate of 38 bpm.  He  remains overall asymptomatic. Heart rate raises to 60bpm on exertion. He saw Dr. Fernande, who felt observation was OK given lack of symptoms and appropriate heart rate response. We will have him continue to see EP. Emergent symptoms were reviewed with the patient.   HTN BP is mildly elevated, but it is better at home. Continue lisinopril  and spironolactone.     Dispo: Follow-up in 6 months  Signed, Aiman Noe VEAR Fishman, PA-C   "

## 2024-04-13 ENCOUNTER — Encounter: Payer: Self-pay | Admitting: Medical

## 2024-04-13 ENCOUNTER — Ambulatory Visit: Attending: Medical | Admitting: Medical

## 2024-04-13 VITALS — BP 150/60 | HR 39 | Ht 70.0 in | Wt 223.0 lb

## 2024-04-13 DIAGNOSIS — I441 Atrioventricular block, second degree: Secondary | ICD-10-CM | POA: Diagnosis not present

## 2024-04-13 DIAGNOSIS — I442 Atrioventricular block, complete: Secondary | ICD-10-CM | POA: Diagnosis not present

## 2024-04-13 DIAGNOSIS — I10 Essential (primary) hypertension: Secondary | ICD-10-CM

## 2024-04-13 NOTE — Patient Instructions (Signed)
 Medication Instructions:  Your physician recommends that you continue on your current medications as directed. Please refer to the Current Medication list given to you today.   *If you need a refill on your cardiac medications before your next appointment, please call your pharmacy*  Lab Work: None ordered at this time  If you have labs (blood work) drawn today and your tests are completely normal, you will receive your results only by: MyChart Message (if you have MyChart) OR A paper copy in the mail If you have any lab test that is abnormal or we need to change your treatment, we will call you to review the results.  Testing/Procedures: None ordered at this time   Follow-Up: At [facility] you and your health needs are our priority.  As part of our continuing mission to provide you with exceptional heart care, our providers are all part of one team.  This team includes your primary Cardiologist (physician) and Advanced Practice Providers or APPs (Physician Assistants and Nurse Practitioners) who all work together to provide you with the care you need, when you need it.  Your next appointment:   3 months with Dr. Kennyth of Electrophysiology  5 - 6 month(s) With General Cardiology  Provider:   Redell Cave, MD or Cadence Franchester, [place]    We recommend signing up for the patient portal called MyChart.  Sign up information is provided on this After Visit Summary.  MyChart is used to connect with patients for Virtual Visits (Telemedicine).  Patients are able to view lab/test results, encounter notes, upcoming appointments, etc.  Non-urgent messages can be sent to your provider as well.   To learn more about what you can do with MyChart, go to [email or web address].

## 2024-04-29 ENCOUNTER — Other Ambulatory Visit: Payer: Self-pay | Admitting: Medical

## 2024-05-02 ENCOUNTER — Other Ambulatory Visit: Payer: Self-pay

## 2024-05-02 MED ORDER — LISINOPRIL 10 MG PO TABS: 10.0000 mg | ORAL_TABLET | Freq: Every day | ORAL | 3 refills | Status: AC

## 2024-05-02 NOTE — Telephone Encounter (Signed)
 In accordance with refill protocols, please review and address the following requirements before this medication refill can be authorized:  Labs

## 2024-09-16 ENCOUNTER — Ambulatory Visit: Payer: PRIVATE HEALTH INSURANCE | Admitting: Medical
